# Patient Record
Sex: Female | Born: 1985 | Race: Black or African American | Hispanic: No | Marital: Single | Smoking: Former smoker
Health system: Southern US, Community
[De-identification: ages and names within clinical notes are randomized; demographics above are authoritative.]

## PROBLEM LIST (undated history)

## (undated) DIAGNOSIS — F32A Depression, unspecified: Secondary | ICD-10-CM

## (undated) DIAGNOSIS — F329 Major depressive disorder, single episode, unspecified: Secondary | ICD-10-CM

## (undated) HISTORY — PX: TUBAL LIGATION: SHX77

## (undated) HISTORY — PX: ANKLE SURGERY: SHX546

---

## 2005-07-05 ENCOUNTER — Emergency Department (HOSPITAL_COMMUNITY): Admission: EM | Admit: 2005-07-05 | Discharge: 2005-07-05 | Payer: Self-pay | Admitting: Emergency Medicine

## 2015-10-30 ENCOUNTER — Encounter (HOSPITAL_BASED_OUTPATIENT_CLINIC_OR_DEPARTMENT_OTHER): Payer: Self-pay | Admitting: Emergency Medicine

## 2015-10-30 ENCOUNTER — Emergency Department (HOSPITAL_BASED_OUTPATIENT_CLINIC_OR_DEPARTMENT_OTHER)
Admission: EM | Admit: 2015-10-30 | Discharge: 2015-10-30 | Disposition: A | Discharge status: Home / Self Care | Payer: Medicaid Other | Attending: Emergency Medicine | Admitting: Emergency Medicine

## 2015-10-30 ENCOUNTER — Emergency Department (HOSPITAL_BASED_OUTPATIENT_CLINIC_OR_DEPARTMENT_OTHER): Payer: Medicaid Other

## 2015-10-30 DIAGNOSIS — R1031 Right lower quadrant pain: Secondary | ICD-10-CM

## 2015-10-30 DIAGNOSIS — F17200 Nicotine dependence, unspecified, uncomplicated: Secondary | ICD-10-CM | POA: Insufficient documentation

## 2015-10-30 DIAGNOSIS — N390 Urinary tract infection, site not specified: Secondary | ICD-10-CM | POA: Diagnosis not present

## 2015-10-30 LAB — CBC
HCT: 39.9 % (ref 36.0–46.0)
Hemoglobin: 12.8 g/dL (ref 12.0–15.0)
MCH: 27 pg (ref 26.0–34.0)
MCHC: 32.1 g/dL (ref 30.0–36.0)
MCV: 84.2 fL (ref 78.0–100.0)
PLATELETS: 224 10*3/uL (ref 150–400)
RBC: 4.74 MIL/uL (ref 3.87–5.11)
RDW: 13.3 % (ref 11.5–15.5)
WBC: 9.8 10*3/uL (ref 4.0–10.5)

## 2015-10-30 LAB — COMPREHENSIVE METABOLIC PANEL
ALK PHOS: 75 U/L (ref 38–126)
ALT: 10 U/L — AB (ref 14–54)
AST: 16 U/L (ref 15–41)
Albumin: 4.2 g/dL (ref 3.5–5.0)
Anion gap: 5 (ref 5–15)
BUN: 11 mg/dL (ref 6–20)
CHLORIDE: 105 mmol/L (ref 101–111)
CO2: 27 mmol/L (ref 22–32)
CREATININE: 0.64 mg/dL (ref 0.44–1.00)
Calcium: 9.8 mg/dL (ref 8.9–10.3)
GFR calc Af Amer: >60 mL/min (ref >60)
GFR calc non Af Amer: >60 mL/min (ref >60)
Glucose, Bld: 95 mg/dL (ref 65–99)
Potassium: 3.8 mmol/L (ref 3.5–5.1)
Sodium: 137 mmol/L (ref 135–145)
Total Bilirubin: 0.5 mg/dL (ref 0.3–1.2)
Total Protein: 7.4 g/dL (ref 6.5–8.1)

## 2015-10-30 LAB — URINALYSIS, ROUTINE W REFLEX MICROSCOPIC
BILIRUBIN URINE: NEGATIVE
Glucose, UA: NEGATIVE mg/dL
Hgb urine dipstick: NEGATIVE
KETONES UR: NEGATIVE mg/dL
NITRITE: NEGATIVE
Protein, ur: NEGATIVE mg/dL
Specific Gravity, Urine: 1.005 (ref 1.005–1.030)
pH: 6.5 (ref 5.0–8.0)

## 2015-10-30 LAB — DIFFERENTIAL
Basophils Absolute: 0 10*3/uL (ref 0.0–0.1)
Basophils Relative: 0 %
EOS PCT: 0 %
Eosinophils Absolute: 0 10*3/uL (ref 0.0–0.7)
LYMPHS PCT: 27 %
Lymphs Abs: 2.6 10*3/uL (ref 0.7–4.0)
MONO ABS: 0.6 10*3/uL (ref 0.1–1.0)
Monocytes Relative: 6 %
Neutro Abs: 6.5 10*3/uL (ref 1.7–7.7)
Neutrophils Relative %: 67 %

## 2015-10-30 LAB — URINE MICROSCOPIC-ADD ON: RBC / HPF: NONE SEEN RBC/hpf (ref 0–5)

## 2015-10-30 LAB — PREGNANCY, URINE: PREG TEST UR: NEGATIVE

## 2015-10-30 LAB — LIPASE, BLOOD: LIPASE: 26 U/L (ref 11–51)

## 2015-10-30 MED ORDER — ONDANSETRON HCL 4 MG PO TABS: 4.0000 mg | ORAL_TABLET | Freq: Four times a day (QID) | ORAL | 0 refills | Status: DC | PRN

## 2015-10-30 MED ORDER — NITROFURANTOIN MONOHYD MACRO 100 MG PO CAPS
100.0000 mg | ORAL_CAPSULE | Freq: Once | ORAL | Status: AC
  Administered 2015-10-30: 100 mg via ORAL
  Filled 2015-10-30: qty 1

## 2015-10-30 MED ORDER — SODIUM CHLORIDE 0.9 % IV SOLN
1000.0000 mL | Freq: Once | INTRAVENOUS | Status: AC
  Administered 2015-10-30: 1000 mL via INTRAVENOUS

## 2015-10-30 MED ORDER — OXYCODONE-ACETAMINOPHEN 5-325 MG PO TABS: 1.0000 | ORAL_TABLET | ORAL | 0 refills | Status: DC | PRN

## 2015-10-30 MED ORDER — NITROFURANTOIN MONOHYD MACRO 100 MG PO CAPS: 100.0000 mg | ORAL_CAPSULE | Freq: Two times a day (BID) | ORAL | 0 refills | Status: DC

## 2015-10-30 MED ORDER — SODIUM CHLORIDE 0.9 % IV SOLN: 1000.0000 mL | INTRAVENOUS | Status: DC

## 2015-10-30 MED ORDER — ONDANSETRON HCL 4 MG/2ML IJ SOLN
4.0000 mg | Freq: Once | INTRAMUSCULAR | Status: AC
  Administered 2015-10-30: 4 mg via INTRAVENOUS
  Filled 2015-10-30: qty 2

## 2015-10-30 MED ORDER — IOPAMIDOL (ISOVUE-300) INJECTION 61%
100.0000 mL | Freq: Once | INTRAVENOUS | Status: AC | PRN
  Administered 2015-10-30: 100 mL via INTRAVENOUS

## 2015-10-30 MED ORDER — MORPHINE SULFATE (PF) 4 MG/ML IV SOLN
4.0000 mg | Freq: Once | INTRAVENOUS | Status: AC
  Administered 2015-10-30: 4 mg via INTRAVENOUS
  Filled 2015-10-30: qty 1

## 2015-10-30 NOTE — ED Notes (Signed)
MD at bedside. 

## 2015-10-30 NOTE — ED Notes (Signed)
Arm band made during downtime and will not scan correctly. Registration made aware and making new arm band.

## 2015-10-30 NOTE — ED Provider Notes (Signed)
[facility]   CSN: [id number] Arrival date & time: 10/30/15  0121  First Provider Contact:  First MD Initiated Contact with Patient 10/30/15 [phone]        History   Chief Complaint Chief Complaint  Patient presents with  . Abdominal Pain    HPI Renee Mcdonald is a 30 y.o. female.  The history is provided by the patient.  Abdominal Pain    She complains of right mid and lower abdominal pain which started about 6 PM and has been getting worse. It started out as a cramping became a steady pain. She rates pain at 7/10. It is worse with walking, laughing. Nothing makes it better. There is associated nausea but no vomiting. She denies urinary urgency, frequency, tenesmus, dysuria. She denies vaginal discharge. There is patient or diarrhea. She denies fever or chills. She is status post tubal ligation. Last menses was July 27 and was normal for her.  History reviewed. No pertinent past medical history.  There are no active problems to display for this patient.   Past Surgical History:  Procedure Laterality Date  . TUBAL LIGATION      OB History    No data available       Home Medications    Prior to Admission medications   Not on File    Family History No family history on file.  Social History Social History  Substance Use Topics  . Smoking status: Current Every Day Smoker    Packs/day: 0.50  . Smokeless tobacco: Never Used  . Alcohol use Yes     Comment: occ     Allergies   Review of patient's allergies indicates no known allergies.   Review of Systems Review of Systems  Gastrointestinal: Positive for abdominal pain.  All other systems reviewed and are negative.    Physical Exam Updated Vital Signs BP 147/100 (BP Location: Left Arm)   Pulse 67   Temp 98.6 F (37 C)   Resp 16   Ht  (1.727 m)   Wt 183 lb (83 kg)   LMP 10/17/2015 (Approximate)   SpO2 100%   BMI 27.83 kg/m   Physical Exam  Nursing note and  vitals reviewed.  30 year old female, resting comfortably and in no acute distress. Vital signs are significant for hypertension. Oxygen saturation is 100%, which is normal. Head is normocephalic and atraumatic. PERRLA, EOMI. Oropharynx is clear. Neck is nontender and supple without adenopathy or JVD. Back is nontender and there is no CVA tenderness. Lungs are clear without rales, wheezes, or rhonchi. Chest is nontender. Heart has regular rate and rhythm without murmur. Abdomen is soft, flat, with moderate tenderness in the right mid and lower abdomen. Tenderness is poorly localized. There is no rebound or guarding. There are no masses or hepatosplenomegaly and peristalsis is hypoactive. Extremities have no cyanosis or edema, full range of motion is present. Skin is warm and dry without rash. Neurologic: Mental status is normal, cranial nerves are intact, there are no motor or sensory deficits.  ED Treatments / Results  Labs (all labs ordered are listed, but only abnormal results are displayed) Labs Reviewed  URINALYSIS, ROUTINE W REFLEX MICROSCOPIC (NOT AT [facility]) - Abnormal; Notable for the following:       Result Value   Leukocytes, UA TRACE (*)    All other components within normal limits  URINE MICROSCOPIC-ADD ON - Abnormal; Notable for the following:    Squamous Epithelial / LPF 0-5 (*)  Bacteria, UA MANY (*)    All other components within normal limits  COMPREHENSIVE METABOLIC PANEL - Abnormal; Notable for the following:    ALT 10 (*)    All other components within normal limits  URINE CULTURE  PREGNANCY, URINE  CBC  DIFFERENTIAL  LIPASE, BLOOD   Radiology Ct Abdomen Pelvis W Contrast  Result Date: 10/30/2015 CLINICAL DATA:  Initial evaluation for acute right lower quadrant abdominal pain. Nausea. EXAM: CT ABDOMEN AND PELVIS WITH CONTRAST TECHNIQUE: Multidetector CT imaging of the abdomen and pelvis was performed using the standard protocol following bolus administration  of intravenous contrast. CONTRAST:  100mL ISOVUE-300 IOPAMIDOL (ISOVUE-300) INJECTION 61% COMPARISON:  Mild subsegmental atelectasis seen dependently within the visualized lung bases. Visualized lungs are otherwise clear. Liver demonstrates a normal contrast enhanced appearance. Gallbladder within normal limits. No biliary dilatation. Spleen, adrenal glands, and pancreas within normal limits. Kidneys are equal in size with symmetric enhancement. 3 mm nonobstructive stone present within the upper pole left kidney. Subcentimeter hypodensity within the left kidney noted, too small the characterize, but statistically likely reflects a small cyst. No hydronephrosis. No focal enhancing renal mass. Stomach within normal limits. No evidence for bowel obstruction. Appendix well visualized in the right lower quadrant and is of normal caliber and appearance associated inflammatory changes to suggest acute appendicitis. No abnormal wall thickening, mucosal enhancement, or inflammatory fat stranding seen about the bowels. Bladder partially distended without acute abnormality. Uterus and ovaries within normal limits for patient age. No free intraperitoneal air. No free fluid. No pathologically enlarged intra-abdominal or pelvic lymph nodes. Normal intravascular enhancement seen throughout the intra-abdominal aorta and its branch vessels. No acute osseous abnormality. No worrisome lytic or blastic osseous lesions. FINDINGS: 1. No CT evidence for acute intra-abdominal or pelvic process. 2. Normal appendix. 3. 3 mm nonobstructive left renal calculus. Electronically Signed   By: Rise MuBenjamin  McClintock M.D.   On: 10/30/2015 04:14    Procedures Procedures (including critical care time)  Medications Ordered in ED Medications  0.9 %  sodium chloride infusion (0 mLs Intravenous Stopped 10/30/15 0325)    Followed by  0.9 %  sodium chloride infusion (not administered)  nitrofurantoin (macrocrystal-monohydrate) (MACROBID) capsule 100  mg (not administered)  ondansetron (ZOFRAN) injection 4 mg (4 mg Intravenous Given 10/30/15 0232)  morphine 4 MG/ML injection 4 mg (4 mg Intravenous Given 10/30/15 0232)  iopamidol (ISOVUE-300) 61 % injection 100 mL (100 mLs Intravenous Contrast Given 10/30/15 0332)     Initial Impression / Assessment and Plan / ED Course  I have reviewed the triage vital signs and the nursing notes.  Pertinent labs & imaging results that were available during my care of the patient were reviewed by me and considered in my medical decision making (see chart for details).  Clinical Course    Abdominal pain concerning for appendicitis. She will be sent for CT of abdomen and pelvis. Urinalysis does show significant amount of bacteria although no pyuria. Urine will be sent for culture.  CT shows no acute process. No explanation for pain other than urinary tract infection. She is given a prescription for nitrofurantoin and also 2 days supply of oxycodone-acetaminophen and also ondansetron. Return precautions discussed  Final Clinical Impressions(s) / ED Diagnoses   Final diagnoses:  Right lower quadrant abdominal pain  Urinary tract infection without hematuria, site unspecified    New Prescriptions New Prescriptions   NITROFURANTOIN, MACROCRYSTAL-MONOHYDRATE, (MACROBID) 100 MG CAPSULE    Take 1 capsule (100 mg total) by mouth 2 (  two) times daily. X 7 days   ONDANSETRON (ZOFRAN) 4 MG TABLET    Take 1 tablet (4 mg total) by mouth every 6 (six) hours as needed for nausea or vomiting.   OXYCODONE-ACETAMINOPHEN (PERCOCET) 5-325 MG TABLET    Take 1 tablet by mouth every 4 (four) hours as needed for moderate pain.     Dione Booze, MD 10/30/15 [phone]

## 2015-10-30 NOTE — ED Triage Notes (Signed)
Sharp RLQ pain that started at 5pm.  Tender to touch.  Increased pain with walking. Denies N/V/D.

## 2015-10-31 LAB — URINE CULTURE

## 2015-12-09 ENCOUNTER — Emergency Department (HOSPITAL_BASED_OUTPATIENT_CLINIC_OR_DEPARTMENT_OTHER)
Admission: EM | Admit: 2015-12-09 | Discharge: 2015-12-10 | Disposition: A | Discharge status: Home / Self Care | Payer: Medicaid Other | Attending: Emergency Medicine | Admitting: Emergency Medicine

## 2015-12-09 ENCOUNTER — Encounter (HOSPITAL_BASED_OUTPATIENT_CLINIC_OR_DEPARTMENT_OTHER): Payer: Self-pay | Admitting: *Deleted

## 2015-12-09 ENCOUNTER — Emergency Department (HOSPITAL_BASED_OUTPATIENT_CLINIC_OR_DEPARTMENT_OTHER): Payer: Medicaid Other

## 2015-12-09 DIAGNOSIS — R69 Illness, unspecified: Secondary | ICD-10-CM | POA: Diagnosis present

## 2015-12-09 DIAGNOSIS — F17200 Nicotine dependence, unspecified, uncomplicated: Secondary | ICD-10-CM | POA: Diagnosis not present

## 2015-12-09 DIAGNOSIS — J069 Acute upper respiratory infection, unspecified: Secondary | ICD-10-CM | POA: Insufficient documentation

## 2015-12-09 NOTE — ED Triage Notes (Signed)
Pt here for feeling ill, pt has had cold and fever and body aches.  This all began this am.  No cough

## 2015-12-09 NOTE — ED Provider Notes (Signed)
[facility]   CSN: [id number] Arrival date & time: 12/09/15  2214  By signing my name below, I, Rosario AdieWilliam Andrew Hiatt, attest that this documentation has been prepared under the direction and in the presence of Audry Piliyler Jaquarious Grey, PA-C.  Electronically Signed: Rosario AdieWilliam Andrew Hiatt, ED Scribe. 12/09/15. 11:44 PM.  History   Chief Complaint Chief Complaint  Patient presents with  . Illness   The history is provided by the patient. No language interpreter was used.   HPI Comments: Renee Mcdonald is a 30 y.o. female with no other PMHx, who presents to the Emergency Department complaining of generalized malaise onset ~13 hours PTA. Associated symptoms include chills, subjective fever, nausea, light-headedness, loss of appetite. She describes her body aches as "1000 needles stabbing me at one time". No treatments were tried prior to coming into the ED. Pt is currently tolerating PO well. Denies cough, SOB, sore throat, rhinorrhea, ear pain, congestion, emesis, diarrhea, abdominal pain, chest pain, dysuria, neck stiffness, or any other associated symptoms.   History reviewed. No pertinent past medical history.  There are no active problems to display for this patient.  Past Surgical History:  Procedure Laterality Date  . TUBAL LIGATION     OB History    No data available     Home Medications    Prior to Admission medications   Medication Sig Start Date End Date Taking? Authorizing Provider  nitrofurantoin, macrocrystal-monohydrate, (MACROBID) 100 MG capsule Take 1 capsule (100 mg total) by mouth 2 (two) times daily. X 7 days 10/30/15   Dione Boozeavid Glick, MD  ondansetron [facility](ZOFRAN) 4 MG tablet Take 1 tablet (4 mg total) by mouth every 6 (six) hours as needed for nausea or vomiting. 10/30/15   Dione Boozeavid Glick, MD  oxyCODONE-acetaminophen (PERCOCET) 5-325 MG tablet Take 1 tablet by mouth every 4 (four) hours as needed for moderate pain. 10/30/15   Dione Boozeavid Glick, MD   Family  History No family history on file.  Social History Social History  Substance Use Topics  . Smoking status: Current Every Day Smoker    Packs/day: 0.50  . Smokeless tobacco: Never Used  . Alcohol use Yes     Comment: occ   Allergies   Review of patient's allergies indicates no known allergies.  Review of Systems Review of Systems  Constitutional: Positive for chills and fever (subjective).  HENT: Negative for congestion, ear pain, rhinorrhea and sore throat.   Respiratory: Negative for cough and shortness of breath.   Cardiovascular: Negative for chest pain.  Gastrointestinal: Positive for nausea. Negative for abdominal pain, diarrhea and vomiting.  Genitourinary: Negative for dysuria.  Musculoskeletal: Positive for myalgias (generalized). Negative for neck stiffness.  Neurological: Positive for light-headedness.   Physical Exam Updated Vital Signs BP 130/90 (BP Location: Left Arm)   Pulse 108   Temp 100.4 F (38 C) (Oral)   Resp 16   Ht 5\' 8"  (1.727 m)   Wt 180 lb (81.6 kg)   SpO2 97%   BMI 27.37 kg/m   Physical Exam  Constitutional: She is oriented to person, place, and time. Vital signs are normal. She appears well-developed and well-nourished.  HENT:  Head: Normocephalic and atraumatic.  Right Ear: Hearing, tympanic membrane and ear canal normal.  Left Ear: Hearing, tympanic membrane and ear canal normal.  Nose: Nose normal.  Mouth/Throat: Uvula is midline, oropharynx is clear and moist and mucous membranes are normal. No trismus in the jaw. No uvula swelling. No oropharyngeal exudate.  No signs  of tonsillar hypertrophy, no exudates. Uvula is midline without edema or erythema. No trismus, or dooling. Phonation and speech are intact. No signs of peritonsillar abscess.    Eyes: Conjunctivae and EOM are normal. Pupils are equal, round, and reactive to light.  Neck: Trachea normal, normal range of motion, full passive range of motion without pain and phonation normal.  Neck supple. No Brudzinski's sign and no Kernig's sign noted.  Cardiovascular: Regular rhythm.  Tachycardia present.   Pulmonary/Chest: Effort normal and breath sounds normal. No respiratory distress. She has no decreased breath sounds. She has no wheezes. She has no rhonchi. She has no rales.  Abdominal: She exhibits no distension.  Musculoskeletal: Normal range of motion.  Neurological: She is alert and oriented to person, place, and time.  Skin: Skin is warm and dry.  Psychiatric: She has a normal mood and affect. Her speech is normal and behavior is normal. Thought content normal.  Nursing note and vitals reviewed.  ED Treatments / Results  DIAGNOSTIC STUDIES: Oxygen Saturation is 97% on RA, normal by my interpretation.   COORDINATION OF CARE: 11:43 PM-Discussed next steps with pt. Pt verbalized understanding and is agreeable with the plan.   Labs (all labs ordered are listed, but only abnormal results are displayed) Labs Reviewed - No data to display  EKG  EKG Interpretation None      Radiology Dg Chest 2 View  Result Date: 12/10/2015 CLINICAL DATA:  Fever, shortness of breath, and cough since this morning. Current smoker. EXAM: CHEST  2 VIEW COMPARISON:  None. FINDINGS: The heart size and mediastinal contours are within normal limits. Both lungs are clear. The visualized skeletal structures are unremarkable. IMPRESSION: No active cardiopulmonary disease. Electronically Signed   By: Burman Nieves M.D.   On: 12/10/2015 00:00    Procedures Procedures (including critical care time)  Medications Ordered in ED Medications - No data to display  Initial Impression / Assessment and Plan / ED Course  I have reviewed the triage vital signs and the nursing notes.  Pertinent labs & imaging results that were available during my care of the patient were reviewed by me and considered in my medical decision making (see chart for details).  Clinical Course   I have reviewed and  evaluated the relevant imaging studies.  I have reviewed the relevant previous healthcare records. I obtained HPI from historian.  ED Course:  Assessment: Pt is a 29yo female presents with generalized malaise, fever, chills, and nausea since this morning. On exam, pt in NAD. VSS. Pt is febrile in the ED with a temperature of 100.4. Lungs CTA, Heart RRR. Abdomen nontender/soft. Pt CXR negative for acute infiltrate. Patients symptoms are consistent with URI, likely viral etiology. Discussed that antibiotics are not indicated for viral infections. Pt will be discharged with symptomatic treatment.  Verbalizes understanding and is agreeable with plan. Pt is hemodynamically stable & in NAD prior to dc.  Disposition/Plan:  DC HOme Additional Verbal discharge instructions given and discussed with patient.  Pt Instructed to f/u with PCP in the next week for evaluation and treatment of symptoms. Return precautions given Pt acknowledges and agrees with plan  Supervising Physician Shon Baton, MD    Final Clinical Impressions(s) / ED Diagnoses   Final diagnoses:  Viral URI    New Prescriptions New Prescriptions   No medications on file   I personally performed the services described in this documentation, which was scribed in my presence. The recorded information has been reviewed  and is accurate.    Audry Pili, PA-C 12/10/15 0005    Shon Baton, MD 12/10/15 [phone]

## 2015-12-10 NOTE — Discharge Instructions (Signed)
Please read and follow all provided instructions.  Your diagnoses today include:  1. Viral URI    You appear to have an upper respiratory infection (URI). An upper respiratory tract infection, or cold, is a viral infection of the air passages leading to the lungs. It should improve gradually after 5-7 days. You may have a lingering cough that lasts for 2- 4 weeks after the infection.  Tests performed today include: Vital signs. See below for your results today.   Medications prescribed:   Take any prescribed medications only as directed. Treatment for your infection is aimed at treating the symptoms. There are no medications, such as antibiotics, that will cure your infection.   Home care instructions:  Follow any educational materials contained in this packet.   Your illness is contagious and can be spread to others, especially during the first 3 or 4 days. It cannot be cured by antibiotics or other medicines. Take basic precautions such as washing your hands often, covering your mouth when you cough or sneeze, and avoiding public places where you could spread your illness to others.   Please continue drinking plenty of fluids.  Use over-the-counter medicines as needed as directed on packaging for symptom relief.  You may also use ibuprofen or tylenol as directed on packaging for pain or fever.  Do not take multiple medicines containing Tylenol or acetaminophen to avoid taking too much of this medication.  Follow-up instructions: Please follow-up with your primary care provider in the next 3 days for further evaluation of your symptoms if you are not feeling better.   Return instructions:  Please return to the Emergency Department if you experience worsening symptoms.  RETURN IMMEDIATELY IF you develop shortness of breath, confusion or altered mental status, a new rash, become dizzy, faint, or poorly responsive, or are unable to be cared for at home. Please return if you have persistent  vomiting and cannot keep down fluids or develop a fever that is not controlled by tylenol or motrin.   Please return if you have any other emergent concerns.  Additional Information:  Your vital signs today were: BP 130/90 (BP Location: Left Arm)    Pulse 108    Temp 100.4 F (38 C) (Oral)    Resp 16    Ht 5\' 8"  (1.727 m)    Wt 81.6 kg    LMP 12/09/2015    SpO2 97%    BMI 27.37 kg/m  If your blood pressure (BP) was elevated above 135/85 this visit, please have this repeated by your doctor within one month. --------------

## 2016-10-25 ENCOUNTER — Emergency Department (HOSPITAL_BASED_OUTPATIENT_CLINIC_OR_DEPARTMENT_OTHER)
Admission: EM | Admit: 2016-10-25 | Discharge: 2016-10-25 | Disposition: A | Discharge status: Home / Self Care | Payer: Medicaid Other | Attending: Emergency Medicine | Admitting: Emergency Medicine

## 2016-10-25 ENCOUNTER — Encounter (HOSPITAL_BASED_OUTPATIENT_CLINIC_OR_DEPARTMENT_OTHER): Payer: Self-pay | Admitting: Emergency Medicine

## 2016-10-25 DIAGNOSIS — R102 Pelvic and perineal pain: Secondary | ICD-10-CM | POA: Insufficient documentation

## 2016-10-25 DIAGNOSIS — Z5321 Procedure and treatment not carried out due to patient leaving prior to being seen by health care provider: Secondary | ICD-10-CM | POA: Diagnosis not present

## 2016-10-25 HISTORY — DX: Major depressive disorder, single episode, unspecified: F32.9

## 2016-10-25 HISTORY — DX: Depression, unspecified: F32.A

## 2016-10-25 LAB — URINALYSIS, ROUTINE W REFLEX MICROSCOPIC
BILIRUBIN URINE: NEGATIVE
GLUCOSE, UA: NEGATIVE mg/dL
HGB URINE DIPSTICK: NEGATIVE
KETONES UR: NEGATIVE mg/dL
Leukocytes, UA: NEGATIVE
Nitrite: NEGATIVE
PROTEIN: NEGATIVE mg/dL
Specific Gravity, Urine: 1.026 (ref 1.005–1.030)
pH: 6 (ref 5.0–8.0)

## 2016-10-25 LAB — PREGNANCY, URINE: PREG TEST UR: NEGATIVE

## 2016-10-25 NOTE — ED Triage Notes (Signed)
Patient states that she has had lower pelvic pain and pressure x 2 -4  Days. Reports N / V

## 2016-10-26 ENCOUNTER — Encounter (HOSPITAL_BASED_OUTPATIENT_CLINIC_OR_DEPARTMENT_OTHER): Payer: Self-pay | Admitting: *Deleted

## 2016-10-26 ENCOUNTER — Emergency Department (HOSPITAL_BASED_OUTPATIENT_CLINIC_OR_DEPARTMENT_OTHER)
Admission: EM | Admit: 2016-10-26 | Discharge: 2016-10-26 | Disposition: A | Discharge status: Home / Self Care | Payer: Medicaid Other | Attending: Emergency Medicine | Admitting: Emergency Medicine

## 2016-10-26 DIAGNOSIS — N760 Acute vaginitis: Secondary | ICD-10-CM | POA: Diagnosis not present

## 2016-10-26 DIAGNOSIS — B9689 Other specified bacterial agents as the cause of diseases classified elsewhere: Secondary | ICD-10-CM

## 2016-10-26 DIAGNOSIS — F17210 Nicotine dependence, cigarettes, uncomplicated: Secondary | ICD-10-CM | POA: Diagnosis not present

## 2016-10-26 DIAGNOSIS — R1030 Lower abdominal pain, unspecified: Secondary | ICD-10-CM | POA: Diagnosis present

## 2016-10-26 LAB — COMPREHENSIVE METABOLIC PANEL
ALBUMIN: 3.8 g/dL (ref 3.5–5.0)
ALT: 11 U/L — ABNORMAL LOW (ref 14–54)
ANION GAP: 5 (ref 5–15)
AST: 16 U/L (ref 15–41)
Alkaline Phosphatase: 73 U/L (ref 38–126)
BUN: 14 mg/dL (ref 6–20)
CHLORIDE: 107 mmol/L (ref 101–111)
CO2: 27 mmol/L (ref 22–32)
Calcium: 9.1 mg/dL (ref 8.9–10.3)
Creatinine, Ser: 0.74 mg/dL (ref 0.44–1.00)
GFR calc Af Amer: >60 mL/min (ref >60)
GFR calc non Af Amer: >60 mL/min (ref >60)
Glucose, Bld: 107 mg/dL — ABNORMAL HIGH (ref 65–99)
POTASSIUM: 3.7 mmol/L (ref 3.5–5.1)
SODIUM: 139 mmol/L (ref 135–145)
Total Bilirubin: 0.3 mg/dL (ref 0.3–1.2)
Total Protein: 6.7 g/dL (ref 6.5–8.1)

## 2016-10-26 LAB — CBC WITH DIFFERENTIAL/PLATELET
BASOS ABS: 0 10*3/uL (ref 0.0–0.1)
Basophils Relative: 0 %
Eosinophils Absolute: 0 10*3/uL (ref 0.0–0.7)
Eosinophils Relative: 0 %
HEMATOCRIT: 36.5 % (ref 36.0–46.0)
HEMOGLOBIN: 11.5 g/dL — AB (ref 12.0–15.0)
LYMPHS PCT: 32 %
Lymphs Abs: 2.2 10*3/uL (ref 0.7–4.0)
MCH: 26.6 pg (ref 26.0–34.0)
MCHC: 31.5 g/dL (ref 30.0–36.0)
MCV: 84.3 fL (ref 78.0–100.0)
MONO ABS: 0.6 10*3/uL (ref 0.1–1.0)
MONOS PCT: 9 %
NEUTROS ABS: 4 10*3/uL (ref 1.7–7.7)
NEUTROS PCT: 59 %
Platelets: 247 10*3/uL (ref 150–400)
RBC: 4.33 MIL/uL (ref 3.87–5.11)
RDW: 13.7 % (ref 11.5–15.5)
WBC: 6.9 10*3/uL (ref 4.0–10.5)

## 2016-10-26 LAB — URINALYSIS, ROUTINE W REFLEX MICROSCOPIC
GLUCOSE, UA: NEGATIVE mg/dL
Hgb urine dipstick: NEGATIVE
Ketones, ur: 15 mg/dL — AB
LEUKOCYTES UA: NEGATIVE
Nitrite: NEGATIVE
PROTEIN: NEGATIVE mg/dL
Specific Gravity, Urine: 1.04 — ABNORMAL HIGH (ref 1.005–1.030)
pH: 5.5 (ref 5.0–8.0)

## 2016-10-26 LAB — LIPASE, BLOOD: Lipase: 26 U/L (ref 11–51)

## 2016-10-26 LAB — WET PREP, GENITAL
SPERM: NONE SEEN
TRICH WET PREP: NONE SEEN
Yeast Wet Prep HPF POC: NONE SEEN

## 2016-10-26 MED ORDER — ONDANSETRON 4 MG PO TBDP: 4.0000 mg | ORAL_TABLET | Freq: Three times a day (TID) | ORAL | 0 refills | Status: DC | PRN

## 2016-10-26 MED ORDER — METRONIDAZOLE 500 MG PO TABS: 500.0000 mg | ORAL_TABLET | Freq: Two times a day (BID) | ORAL | 0 refills | Status: DC

## 2016-10-26 MED ORDER — DICYCLOMINE HCL 10 MG PO CAPS
10.0000 mg | ORAL_CAPSULE | Freq: Once | ORAL | Status: AC
  Administered 2016-10-26: 10 mg via ORAL
  Filled 2016-10-26: qty 1

## 2016-10-26 MED ORDER — SODIUM CHLORIDE 0.9 % IV BOLUS (SEPSIS)
500.0000 mL | Freq: Once | INTRAVENOUS | Status: AC
  Administered 2016-10-26: 500 mL via INTRAVENOUS

## 2016-10-26 MED ORDER — DICYCLOMINE HCL 20 MG PO TABS: 20.0000 mg | ORAL_TABLET | Freq: Two times a day (BID) | ORAL | 0 refills | Status: DC | PRN

## 2016-10-26 MED ORDER — METRONIDAZOLE 500 MG PO TABS
500.0000 mg | ORAL_TABLET | Freq: Once | ORAL | Status: AC
  Administered 2016-10-26: 500 mg via ORAL
  Filled 2016-10-26: qty 1

## 2016-10-26 MED ORDER — ONDANSETRON HCL 4 MG/2ML IJ SOLN
4.0000 mg | Freq: Once | INTRAMUSCULAR | Status: AC
  Administered 2016-10-26: 4 mg via INTRAVENOUS
  Filled 2016-10-26: qty 2

## 2016-10-26 NOTE — Discharge Instructions (Signed)

## 2016-10-26 NOTE — ED Triage Notes (Signed)
C/o left lower abd pain that started 4 days ago. C/o nausea times one week. State she only has emesis in the morning. Denies any fevers. Denies any diarrhea. Denies any urinary symptoms. Describes pain as a dull pressure that is constant. States laying on her left side makes it hurt less. Has taken tylenol without relief.

## 2016-10-26 NOTE — ED Provider Notes (Signed)
Emergency Department Provider Note   I have reviewed the triage vital signs and the nursing notes.   HISTORY  Chief Complaint Abdominal Pain   HPI Renee Mcdonald is a 31 y.o. female with PMH of tubal ligation and depression presents to the emergency department for evaluation of lower abdominal pressure over the last week. Symptoms have worsened and moved to the left lower quadrant of the abdomen. Also experiencing some left-sided lower back pain. No fevers or chills. Denies any vaginal bleeding or discharge. LMP July 28th. Patient has experienced 2 episodes of vomiting this morning which is new for her. No diarrhea. No sick contacts. No recent travel history.   Past Medical History:  Diagnosis Date  . Depression     There are no active problems to display for this patient.   Past Surgical History:  Procedure Laterality Date  . TUBAL LIGATION      Current Outpatient Rx  . Order #: [id number] Class: Historical Med  . Order #: [id number] Class: Print  . Order #: [id number] Class: Print  . Order #: [id number] Class: Print  . Order #: [id number] Class: Print  . Order #: [id number] Class: Print  . Order #: [id number] Class: Print  . Order #: [id number] Class: Historical Med    Allergies Patient has no known allergies.  No family history on file.  Social History Social History  Substance Use Topics  . Smoking status: Current Every Day Smoker    Packs/day: 0.50  . Smokeless tobacco: Never Used  . Alcohol use Yes     Comment: occ    Review of Systems  Constitutional: No fever/chills Eyes: No visual changes. ENT: No sore throat. Cardiovascular: Denies chest pain. Respiratory: Denies shortness of breath. Gastrointestinal: No abdominal pain.  No nausea, no vomiting.  No diarrhea.  No constipation. Genitourinary: Negative for dysuria. Musculoskeletal: Negative for back pain. Skin: Negative for rash. Neurological: Negative for headaches, focal weakness or  numbness.  10-point ROS otherwise negative.  ____________________________________________   PHYSICAL EXAM:  VITAL SIGNS: ED Triage Vitals  Enc Vitals Group     BP 10/26/16 0809 126/83     Pulse Rate 10/26/16 0809 90     Resp 10/26/16 0809 14     Temp 10/26/16 0809 98.9 F (37.2 C)     Temp Source 10/26/16 0809 Oral     SpO2 10/26/16 0809 98 %     Weight 10/26/16 0809 200 lb (90.7 kg)     Height 10/26/16 0809 5' 8.5" (1.74 m)     Pain Score 10/26/16 0821 7   Constitutional: Alert and oriented. Well appearing and in no acute distress. Eyes: Conjunctivae are normal.  Head: Atraumatic. Nose: No congestion/rhinnorhea. Mouth/Throat: Mucous membranes are moist.  Neck: No stridor.   Cardiovascular: Normal rate, regular rhythm. Good peripheral circulation. Grossly normal heart sounds.   Respiratory: Normal respiratory effort.  No retractions. Lungs CTAB. Gastrointestinal: Soft with mild lower abdominal tenderness. No rebound or guarding. No distention.  Genitourinary: Moderate foul-smelling vaginal discharge. No external genital lesions. No vaginal bleeding. No CMT. Mild adnexal tenderness with no fullness appreciated.  Musculoskeletal: No lower extremity tenderness nor edema. No gross deformities of extremities. Neurologic:  Normal speech and language. No gross focal neurologic deficits are appreciated.  Skin:  Skin is warm, dry and intact. No rash noted. Psychiatric: Mood and affect are normal. Speech and behavior are normal.  ____________________________________________   LABS (all labs ordered are listed, but only abnormal results are displayed)  Labs Reviewed  WET PREP, GENITAL - Abnormal;  Notable for the following:       Result Value   Clue Cells Wet Prep HPF POC PRESENT (*)    WBC, Wet Prep HPF POC MANY (*)    All other components within normal limits  URINALYSIS, ROUTINE W REFLEX MICROSCOPIC - Abnormal; Notable for the following:    Color, Urine AMBER (*)     APPearance CLOUDY (*)    Specific Gravity, Urine 1.040 (*)    Bilirubin Urine SMALL (*)    Ketones, ur 15 (*)    All other components within normal limits  COMPREHENSIVE METABOLIC PANEL - Abnormal; Notable for the following:    Glucose, Bld 107 (*)    ALT 11 (*)    All other components within normal limits  CBC WITH DIFFERENTIAL/PLATELET - Abnormal; Notable for the following:    Hemoglobin 11.5 (*)    All other components within normal limits  LIPASE, BLOOD  HIV ANTIBODY (ROUTINE TESTING)  GC/CHLAMYDIA PROBE AMP ([place]) NOT AT [facility]   ____________________________________________  RADIOLOGY  None ____________________________________________   PROCEDURES  Procedure(s) performed:   Procedures  None ____________________________________________   INITIAL IMPRESSION / ASSESSMENT AND PLAN / ED COURSE  Pertinent labs & imaging results that were available during my care of the patient were reviewed by me and considered in my medical decision making (see chart for details).  Patient presents to the emergency department for evaluation of lower abdominal pressure and the left sided discomfort. No fevers or chills. Patient has very mild lower abdominal tenderness to palpation.  Differential diagnosis includes but is not exclusive to ectopic pregnancy, ovarian cyst, ovarian torsion, acute appendicitis, urinary tract infection, endometriosis, bowel obstruction, hernia, colitis, renal colic, gastroenteritis, volvulus etc.  Labs and pelvic largely unremarkable. Plan for treatment of BV. No evidence of PID or TOA. No indication for further abdominal imaging at this time. Discussed supportive care at home and PCP follow up plan.   At this time, I do not feel there is any life-threatening condition present. I have reviewed and discussed all results (EKG, imaging, lab, urine as appropriate), exam findings with patient. I have reviewed nursing notes and appropriate previous records.  I  feel the patient is safe to be discharged home without further emergent workup. Discussed usual and customary return precautions. Patient and family (if present) verbalize understanding and are comfortable with this plan.  Patient will follow-up with their primary care provider. If they do not have a primary care provider, information for follow-up has been provided to them. All questions have been answered.   ____________________________________________  FINAL CLINICAL IMPRESSION(S) / ED DIAGNOSES  Final diagnoses:  Lower abdominal pain  Bacterial vaginosis     MEDICATIONS GIVEN DURING THIS VISIT:  Medications  sodium chloride 0.9 % bolus 500 mL (0 mLs Intravenous Stopped 10/26/16 0944)  ondansetron (ZOFRAN) injection 4 mg (4 mg Intravenous Given 10/26/16 0919)  dicyclomine (BENTYL) capsule 10 mg (10 mg Oral Given 10/26/16 1012)  metroNIDAZOLE (FLAGYL) tablet 500 mg (500 mg Oral Given 10/26/16 1011)     NEW OUTPATIENT MEDICATIONS STARTED DURING THIS VISIT:  Discharge Medication List as of 10/26/2016 10:01 AM    START taking these medications   Details  dicyclomine (BENTYL) 20 MG tablet Take 1 tablet (20 mg total) by mouth 2 (two) times daily as needed for spasms., Starting Mon 10/26/2016, Print    metroNIDAZOLE (FLAGYL) 500 MG tablet Take 1 tablet (500 mg total) by mouth 2 (two) times daily., Starting Mon 10/26/2016, Print  ondansetron (ZOFRAN ODT) 4 MG disintegrating tablet Take 1 tablet (4 mg total) by mouth every 8 (eight) hours as needed for nausea or vomiting., Starting Mon 10/26/2016, Print        Note:  This document was prepared using Dragon voice recognition software and may include unintentional dictation errors.  Alona Bene, MD Emergency Medicine    Brennan Karam, Arlyss Repress, MD 10/26/16 [phone]

## 2016-10-27 LAB — HIV ANTIBODY (ROUTINE TESTING W REFLEX): HIV Screen 4th Generation wRfx: NONREACTIVE

## 2016-10-27 LAB — GC/CHLAMYDIA PROBE AMP (~~LOC~~) NOT AT ARMC
CHLAMYDIA, DNA PROBE: NEGATIVE
NEISSERIA GONORRHEA: NEGATIVE

## 2016-11-04 ENCOUNTER — Emergency Department (HOSPITAL_BASED_OUTPATIENT_CLINIC_OR_DEPARTMENT_OTHER)
Admission: EM | Admit: 2016-11-04 | Discharge: 2016-11-04 | Disposition: A | Discharge status: Home / Self Care | Payer: Medicaid Other | Attending: Emergency Medicine | Admitting: Emergency Medicine

## 2016-11-04 ENCOUNTER — Encounter (HOSPITAL_BASED_OUTPATIENT_CLINIC_OR_DEPARTMENT_OTHER): Payer: Self-pay

## 2016-11-04 DIAGNOSIS — Z79899 Other long term (current) drug therapy: Secondary | ICD-10-CM | POA: Insufficient documentation

## 2016-11-04 DIAGNOSIS — R1030 Lower abdominal pain, unspecified: Secondary | ICD-10-CM | POA: Diagnosis present

## 2016-11-04 DIAGNOSIS — R1032 Left lower quadrant pain: Secondary | ICD-10-CM

## 2016-11-04 DIAGNOSIS — F17200 Nicotine dependence, unspecified, uncomplicated: Secondary | ICD-10-CM | POA: Diagnosis not present

## 2016-11-04 LAB — COMPREHENSIVE METABOLIC PANEL
ALBUMIN: 3.9 g/dL (ref 3.5–5.0)
ALT: 12 U/L — AB (ref 14–54)
AST: 24 U/L (ref 15–41)
Alkaline Phosphatase: 76 U/L (ref 38–126)
Anion gap: 9 (ref 5–15)
BILIRUBIN TOTAL: 0.3 mg/dL (ref 0.3–1.2)
BUN: 9 mg/dL (ref 6–20)
CHLORIDE: 102 mmol/L (ref 101–111)
CO2: 25 mmol/L (ref 22–32)
CREATININE: 0.75 mg/dL (ref 0.44–1.00)
Calcium: 9 mg/dL (ref 8.9–10.3)
GFR calc Af Amer: >60 mL/min (ref >60)
GFR calc non Af Amer: >60 mL/min (ref >60)
GLUCOSE: 187 mg/dL — AB (ref 65–99)
POTASSIUM: 3.5 mmol/L (ref 3.5–5.1)
Sodium: 136 mmol/L (ref 135–145)
TOTAL PROTEIN: 6.6 g/dL (ref 6.5–8.1)

## 2016-11-04 LAB — URINALYSIS, MICROSCOPIC (REFLEX)

## 2016-11-04 LAB — URINALYSIS, ROUTINE W REFLEX MICROSCOPIC
GLUCOSE, UA: NEGATIVE mg/dL
HGB URINE DIPSTICK: NEGATIVE
KETONES UR: 15 mg/dL — AB
Nitrite: NEGATIVE
PH: 5.5 (ref 5.0–8.0)
PROTEIN: NEGATIVE mg/dL
Specific Gravity, Urine: 1.023 (ref 1.005–1.030)

## 2016-11-04 LAB — CBC
HEMATOCRIT: 38.3 % (ref 36.0–46.0)
Hemoglobin: 11.9 g/dL — ABNORMAL LOW (ref 12.0–15.0)
MCH: 26.3 pg (ref 26.0–34.0)
MCHC: 31.1 g/dL (ref 30.0–36.0)
MCV: 84.5 fL (ref 78.0–100.0)
PLATELETS: 278 10*3/uL (ref 150–400)
RBC: 4.53 MIL/uL (ref 3.87–5.11)
RDW: 13.9 % (ref 11.5–15.5)
WBC: 7.5 10*3/uL (ref 4.0–10.5)

## 2016-11-04 LAB — LIPASE, BLOOD: Lipase: 28 U/L (ref 11–51)

## 2016-11-04 LAB — PREGNANCY, URINE: PREG TEST UR: NEGATIVE

## 2016-11-04 NOTE — ED Triage Notes (Signed)
C/o lower abd/lower back pian x 2 weeks-states she was seen here for same but pain is worse-denies n/v/d, vaginal d/c-NAD-steady gait

## 2016-11-04 NOTE — ED Provider Notes (Signed)
[facility]   CSN: [id number] Arrival date & time: 11/04/16  1252     History   Chief Complaint Chief Complaint  Patient presents with  . Abdominal Pain    HPI Renee Mcdonald is a 31 y.o. female.  HPI Patient is a 31 year old female presents to the emergency department with ongoing low abdominal and low back pain.  Denies nausea vomiting diarrhea.  Her pain comes and goes.  No history of ureteral stones.  Status post tubal ligation.  No vaginal complaints.  Was recently seen and evaluated and treated for bacterial vaginosis.  Symptoms are mild in severity.  Patient does not want any pain medication today   Past Medical History:  Diagnosis Date  . Depression     There are no active problems to display for this patient.   Past Surgical History:  Procedure Laterality Date  . TUBAL LIGATION      OB History    No data available       Home Medications    Prior to Admission medications   Medication Sig Start Date End Date Taking? Authorizing Provider  busPIRone (BUSPAR) 7.5 MG tablet Take 7.5 mg by mouth 3 (three) times daily.    [provider]  dicyclomine (BENTYL) 20 MG tablet Take 1 tablet (20 mg total) by mouth 2 (two) times daily as needed for spasms. 10/26/16   Long, Arlyss Repress, MD  sertraline (ZOLOFT) 100 MG tablet Take 100 mg by mouth daily.    [provider]    Family History No family history on file.  Social History Social History  Substance Use Topics  . Smoking status: Current Every Day Smoker    Packs/day: 0.50  . Smokeless tobacco: Never Used  . Alcohol use Yes     Comment: occ     Allergies   Patient has no known allergies.   Review of Systems Review of Systems  All other systems reviewed and are negative.    Physical Exam Updated Vital Signs BP 128/79 (BP Location: Left Arm)   Pulse (!) 101   Temp 98.7 F (37.1 C) (Oral)   Resp 18   Ht 5' 8.5" (1.74 m)   Wt 91.7 kg (202 lb 2 oz)    LMP 11/01/2016   SpO2 100%   BMI 30.29 kg/m   Physical Exam  Constitutional: She is oriented to person, place, and time. She appears well-developed and well-nourished. No distress.  HENT:  Head: Normocephalic and atraumatic.  Eyes: EOM are normal.  Neck: Normal range of motion.  Cardiovascular: Normal rate and regular rhythm.   Pulmonary/Chest: Effort normal and breath sounds normal.  Abdominal: Soft. She exhibits no distension and no mass. There is no tenderness. There is no guarding.  Musculoskeletal: Normal range of motion.  Neurological: She is alert and oriented to person, place, and time.  Skin: Skin is warm and dry.  Psychiatric: She has a normal mood and affect.  Nursing note and vitals reviewed.    ED Treatments / Results  Labs (all labs ordered are listed, but only abnormal results are displayed) Labs Reviewed  COMPREHENSIVE METABOLIC PANEL - Abnormal; Notable for the following:       Result Value   Glucose, Bld 187 (*)    ALT 12 (*)    All other components within normal limits  CBC - Abnormal; Notable for the following:    Hemoglobin 11.9 (*)    All other components within normal limits  URINALYSIS,  ROUTINE W REFLEX MICROSCOPIC - Abnormal; Notable for the following:    APPearance CLOUDY (*)    Bilirubin Urine SMALL (*)    Ketones, ur 15 (*)    Leukocytes, UA SMALL (*)    All other components within normal limits  URINALYSIS, MICROSCOPIC (REFLEX) - Abnormal; Notable for the following:    Bacteria, UA FEW (*)    Squamous Epithelial / LPF 6-30 (*)    All other components within normal limits  LIPASE, BLOOD  PREGNANCY, URINE  HCG, SERUM, QUALITATIVE    EKG  EKG Interpretation None       Radiology No results found.  Procedures Procedures (including critical care time)  Medications Ordered in ED Medications - No data to display   Initial Impression / Assessment and Plan / ED Course  I have reviewed the triage vital signs and the nursing  notes.  Pertinent labs & imaging results that were available during my care of the patient were reviewed by me and considered in my medical decision making (see chart for details).     Overall well-appearing.  Repeat abdominal exam without tenderness.  Labs and urine without significant abnormality.  Primary care follow-up.  Final Clinical Impressions(s) / ED Diagnoses   Final diagnoses:  Left lower quadrant pain    New Prescriptions New Prescriptions   No medications on file     Azalia Bilisampos, Courteny Egler, MD 11/04/16 [phone]

## 2017-11-11 IMAGING — CT CT ABD-PELV W/ CM
2 of 4 series · 15 of 46 positions shown, 17 images · IV contrast (APPLIED)
Comparison: Mild subsegmental atelectasis seen dependently within
the visualized lung bases.

CLINICAL DATA: Initial evaluation for acute right lower quadrant
abdominal pain. Nausea.

EXAM:
CT ABDOMEN AND PELVIS WITH CONTRAST
TECHNIQUE: Multidetector CT imaging of the abdomen and pelvis was performed
using the standard protocol following bolus administration of
intravenous contrast.
CONTRAST:  100mL J2PBE7-CQQ IOPAMIDOL (J2PBE7-CQQ) INJECTION 61%

[Series 2: axial st · axial · 0.80mm/px · z∈[-458,-8]mm · 12 of 104 slices shown, 14 images]
[im 9/104  soft-tissue]
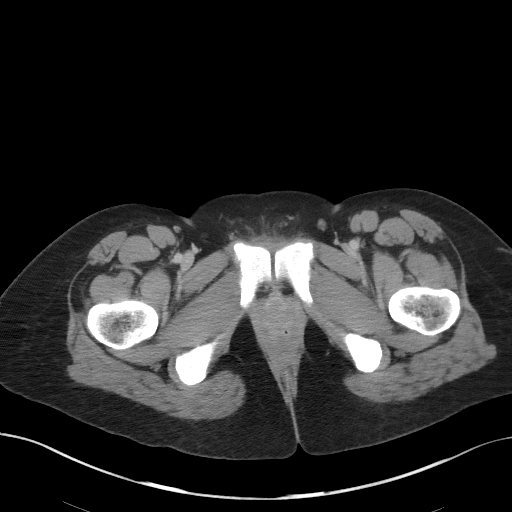
[im 9/104  bone]
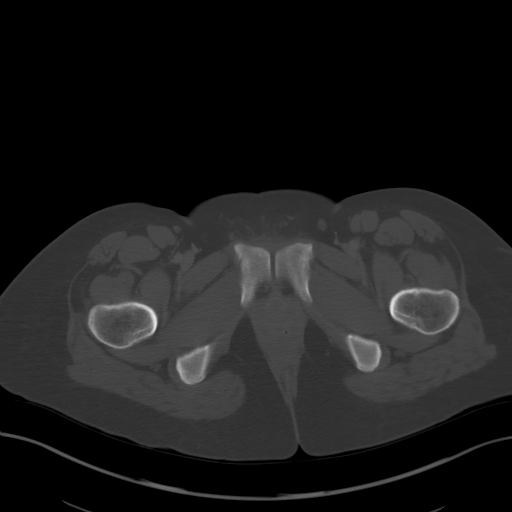
[im 17/104  soft-tissue]
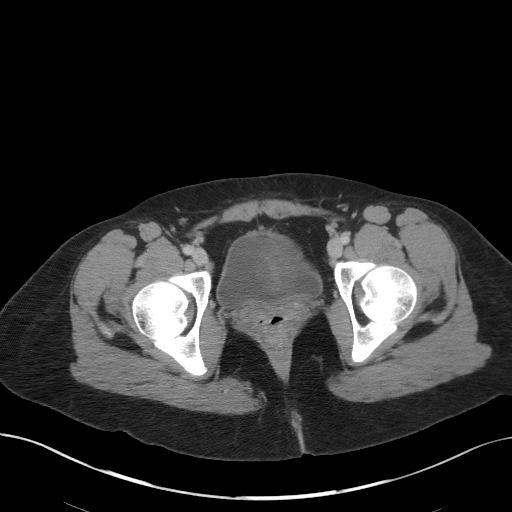
[im 25/104  soft-tissue]
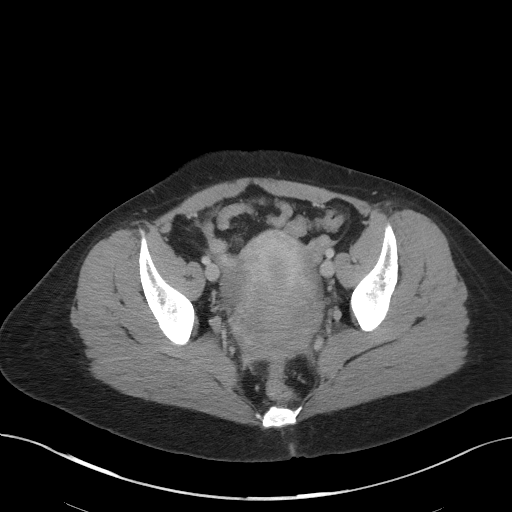
[im 33/104  soft-tissue]
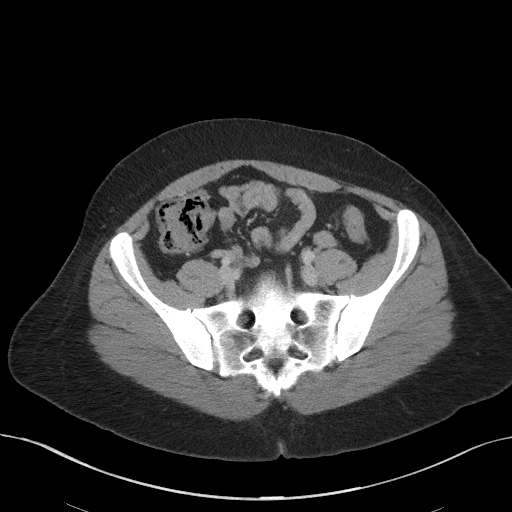
[im 42/104  soft-tissue]
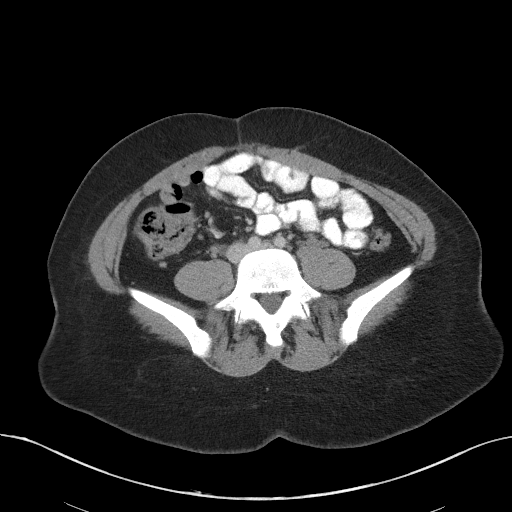
[im 50/104  soft-tissue]
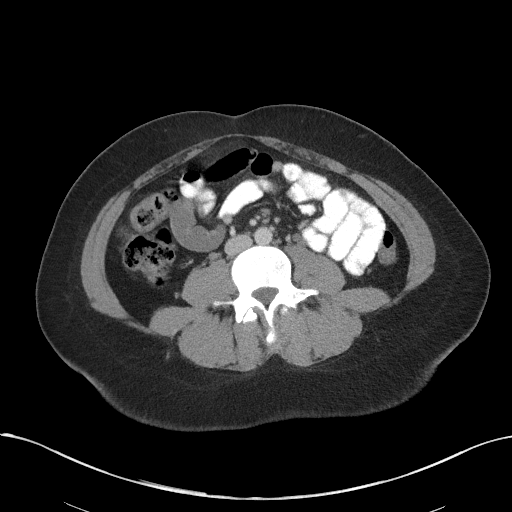
[im 58/104  soft-tissue]
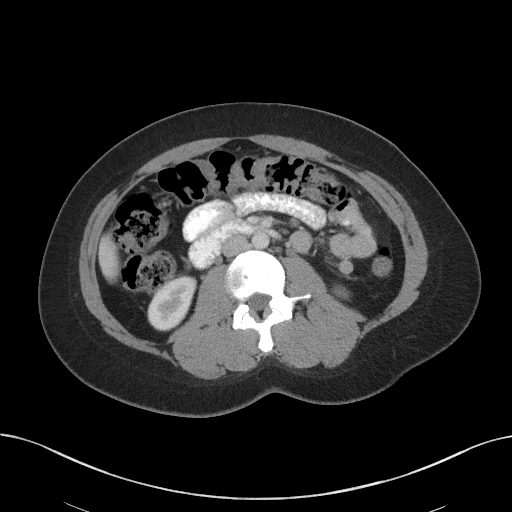
[im 66/104  soft-tissue]
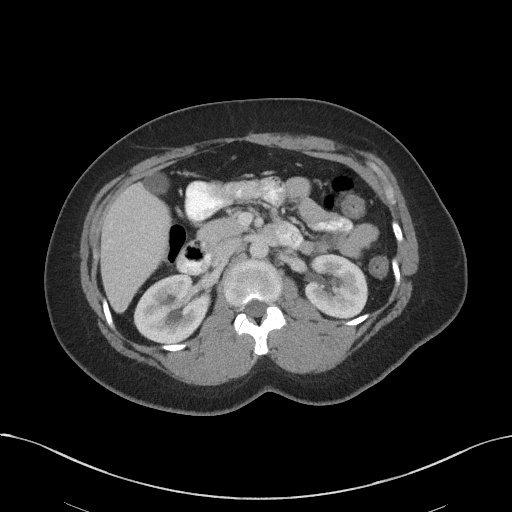
[im 75/104  soft-tissue]
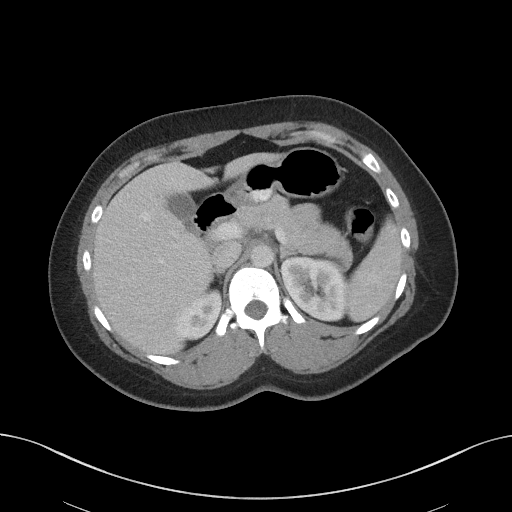
[im 75/104  bone]
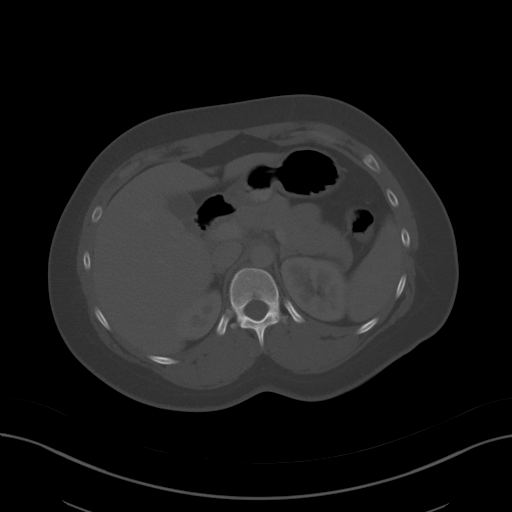
[im 83/104  soft-tissue]
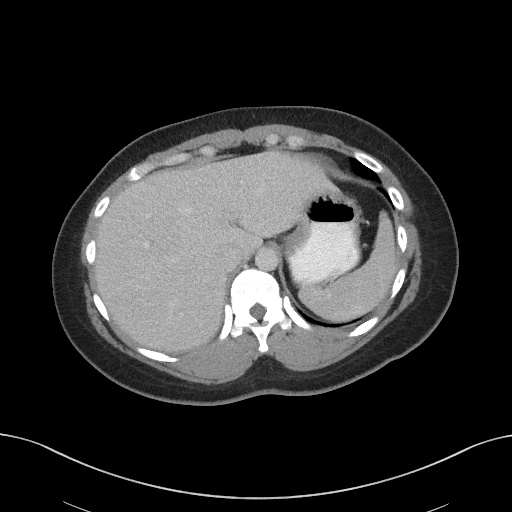
[im 91/104  soft-tissue]
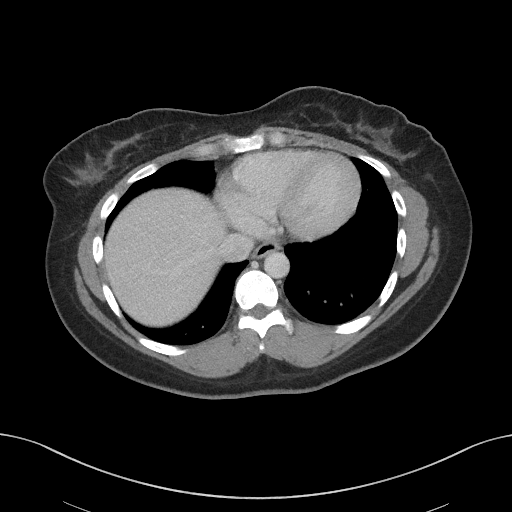
[im 99/104  soft-tissue]
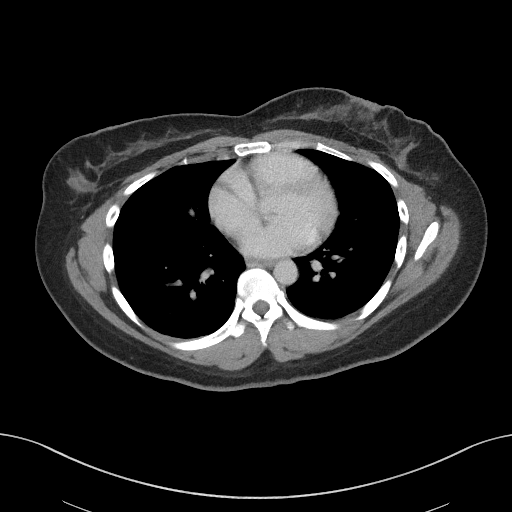

[Series 5: coronal st · coronal · 0.73mm/px · 3 of 80 slices shown]
[im 27/80  soft-tissue]
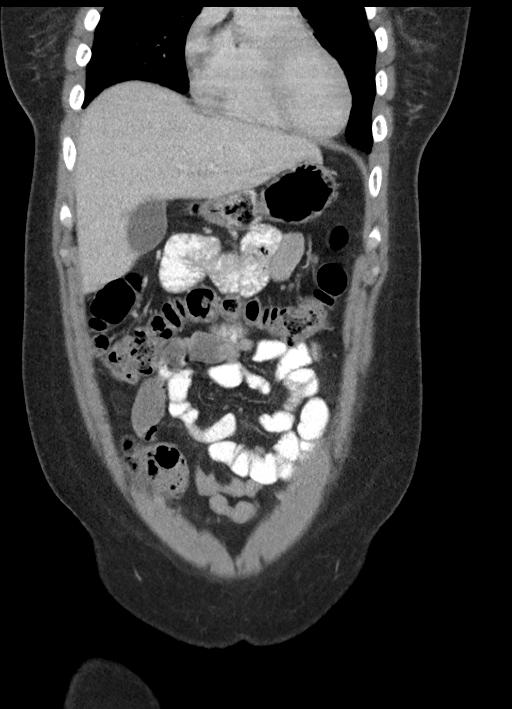
[im 36/80  soft-tissue]
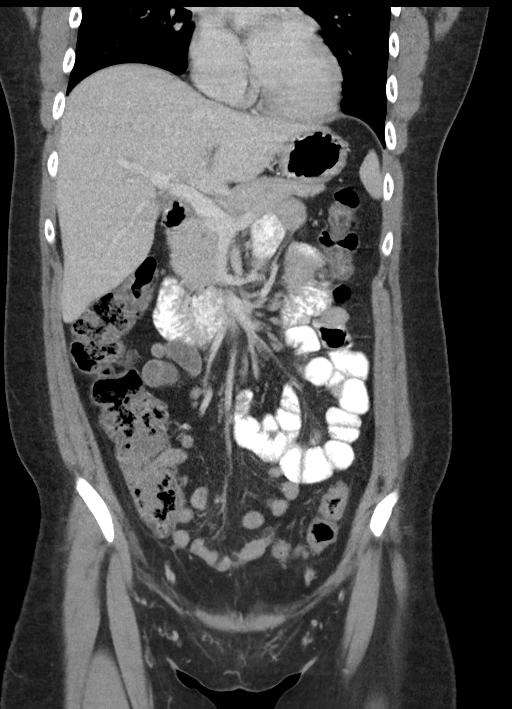
[im 44/80  soft-tissue]
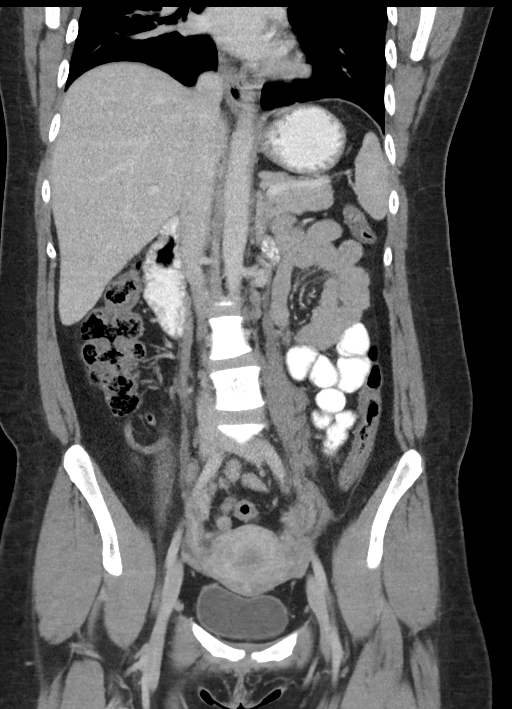

[15 of 46 positions shown; findings below may reference images not displayed]

Visualized lungs are otherwise clear.

Liver demonstrates a normal contrast enhanced appearance.
Gallbladder within normal limits. No biliary dilatation. Spleen,
adrenal glands, and pancreas within normal limits.

Kidneys are equal in size with symmetric enhancement. 3 mm
nonobstructive stone present within the upper pole left kidney.
Subcentimeter hypodensity within the left kidney noted, too small
the characterize, but statistically likely reflects a small cyst. No
hydronephrosis. No focal enhancing renal mass.

Stomach within normal limits. No evidence for bowel obstruction.
Appendix well visualized in the right lower quadrant and is of
normal caliber and appearance associated inflammatory changes to
suggest acute appendicitis. No abnormal wall thickening, mucosal
enhancement, or inflammatory fat stranding seen about the bowels.

Bladder partially distended without acute abnormality. Uterus and
ovaries within normal limits for patient age.

No free intraperitoneal air. No free fluid. No pathologically
enlarged intra-abdominal or pelvic lymph nodes. Normal intravascular
enhancement seen throughout the intra-abdominal aorta and its branch
vessels.

No acute osseous abnormality. No worrisome lytic or blastic osseous
lesions.
FINDINGS: 1. No CT evidence for acute intra-abdominal or pelvic process.
2. Normal appendix.
3. 3 mm nonobstructive left renal calculus.

## 2017-12-21 IMAGING — DX DG CHEST 2V
2 series · 2 of 2 positions shown · non-contrast
Comparison: None.

CLINICAL DATA: Fever, shortness of breath, and cough since this
morning. Current smoker.

EXAM:
CHEST  2 VIEW

[chest pa]
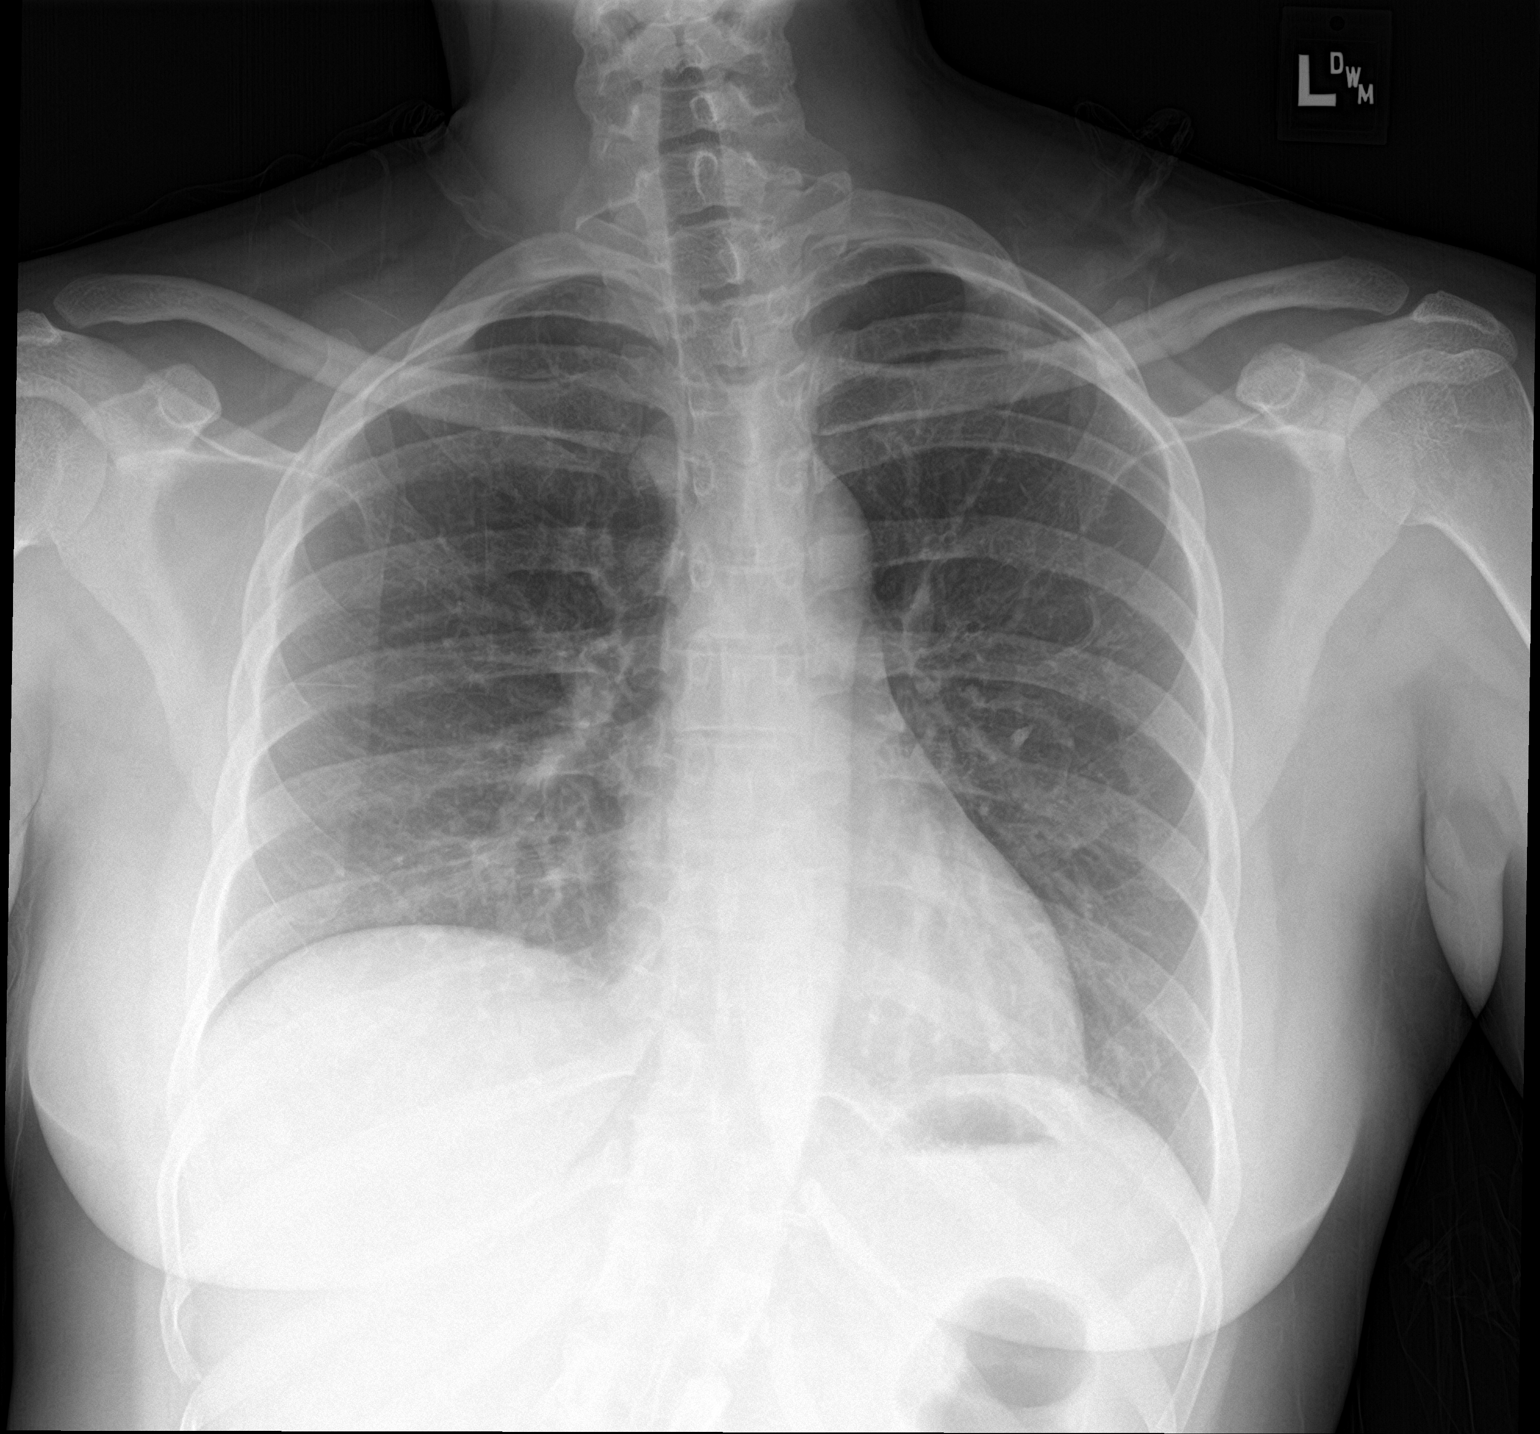

[chest lat]
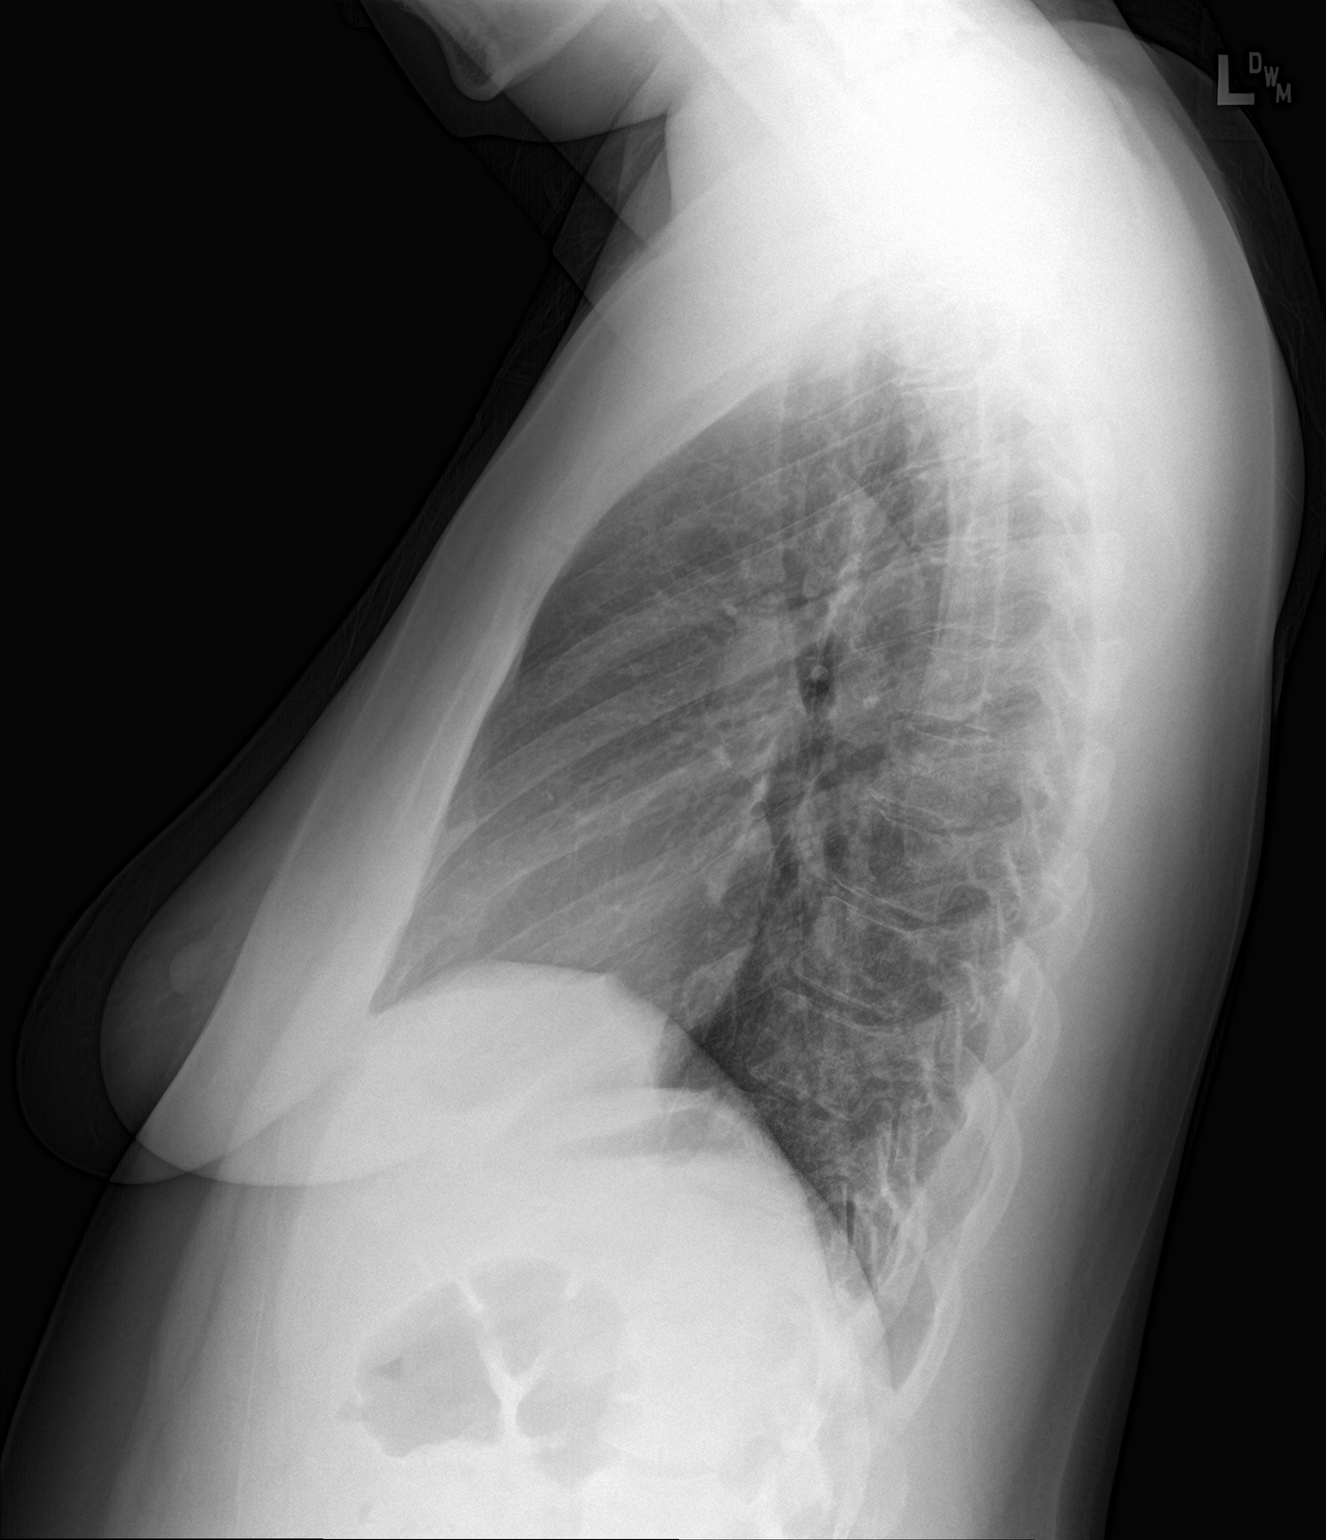

[2 of 2 positions shown; findings below may reference images not displayed]

FINDINGS: The heart size and mediastinal contours are within normal limits.
Both lungs are clear. The visualized skeletal structures are
unremarkable.
IMPRESSION: No active cardiopulmonary disease.

## 2018-06-11 ENCOUNTER — Other Ambulatory Visit: Payer: Self-pay

## 2018-06-11 ENCOUNTER — Emergency Department (HOSPITAL_BASED_OUTPATIENT_CLINIC_OR_DEPARTMENT_OTHER)
Admission: EM | Admit: 2018-06-11 | Discharge: 2018-06-12 | Disposition: A | Discharge status: Home / Self Care | Payer: 59 | Attending: Emergency Medicine | Admitting: Emergency Medicine

## 2018-06-11 ENCOUNTER — Encounter (HOSPITAL_BASED_OUTPATIENT_CLINIC_OR_DEPARTMENT_OTHER): Payer: Self-pay | Admitting: *Deleted

## 2018-06-11 DIAGNOSIS — Z79899 Other long term (current) drug therapy: Secondary | ICD-10-CM | POA: Insufficient documentation

## 2018-06-11 DIAGNOSIS — J029 Acute pharyngitis, unspecified: Secondary | ICD-10-CM | POA: Diagnosis present

## 2018-06-11 DIAGNOSIS — F17210 Nicotine dependence, cigarettes, uncomplicated: Secondary | ICD-10-CM | POA: Diagnosis not present

## 2018-06-11 LAB — GROUP A STREP BY PCR: GROUP A STREP BY PCR: NOT DETECTED

## 2018-06-11 NOTE — ED Provider Notes (Signed)
[facility] Provider Note: Lowella Dell, MD, FACEP  CSN: [id number] MRN: [id number] ARRIVAL: 06/11/18 at 2314 ROOM: [facility]   CHIEF COMPLAINT  Sore Throat   HISTORY OF PRESENT ILLNESS  06/11/18 11:28 PM Renee Mcdonald is a 33 y.o. female with a 1 hour history of throat discomfort.  She describes it as a burning sensation, worse with swallowing and a sensation of difficulty swallowing and breathing.  Symptoms have improved since onset.  She rates her discomfort as a 5 out of 10.  She denies fever, nasal congestion, cough, shortness of breath, vomiting or diarrhea.   Past Medical History:  Diagnosis Date  . Depression     Past Surgical History:  Procedure Laterality Date  . ANKLE SURGERY    . TUBAL LIGATION      No family history on file.  Social History   Tobacco Use  . Smoking status: Current Every Day Smoker    Packs/day: 0.50    Types: Cigarettes  . Smokeless tobacco: Never Used  Substance Use Topics  . Alcohol use: Yes    Comment: occ  . Drug use: No    Prior to Admission medications   Medication Sig Start Date End Date Taking? Authorizing Provider  busPIRone (BUSPAR) 7.5 MG tablet Take 7.5 mg by mouth 3 (three) times daily.    [provider]  dicyclomine (BENTYL) 20 MG tablet Take 1 tablet (20 mg total) by mouth 2 (two) times daily as needed for spasms. 10/26/16   Long, Arlyss Repress, MD  sertraline (ZOLOFT) 100 MG tablet Take 100 mg by mouth daily.    [provider]    Allergies Patient has no known allergies.   REVIEW OF SYSTEMS  Negative except as noted here or in the History of Present Illness.   PHYSICAL EXAMINATION  Initial Vital Signs Blood pressure 124/81, pulse 83, temperature 98.5 F (36.9 C), temperature source Oral, resp. rate 18, height 5\' 8"  (1.727 m), weight 72.6 kg, last menstrual period 05/07/2018, SpO2 100 %.  Examination General: Well-developed, well-nourished female in no acute distress;  appearance consistent with age of record HENT: normocephalic; atraumatic; no pharyngeal erythema, edema or exudate; no trismus; uvula midline; no stridor; no dysphonia Eyes: pupils equal, round and reactive to light; extraocular muscles intact Neck: supple; no lymphadenopathy Heart: regular rate and rhythm Lungs: clear to auscultation bilaterally Abdomen: soft; nondistended; nontender; bowel sounds present Extremities: No deformity; full range of motion; pulses normal Neurologic: Awake, alert and oriented; motor function intact in all extremities and symmetric; no facial droop Skin: Warm and dry Psychiatric: Normal mood and affect   RESULTS  Summary of this visit's results, reviewed by myself:   EKG Interpretation  Date/Time:    Ventricular Rate:    PR Interval:    QRS Duration:   QT Interval:    QTC Calculation:   R Axis:     Text Interpretation:        Laboratory Studies: Results for orders placed or performed during the hospital encounter of 06/11/18 (from the past 24 hour(s))  Group A Strep by PCR     Status: None   Collection Time: 06/11/18 11:25 PM  Result Value Ref Range   Group A Strep by PCR NOT DETECTED NOT DETECTED   Imaging Studies: No results found.  ED COURSE and MDM  Nursing notes and initial vitals signs, including pulse oximetry, reviewed.  Vitals:   06/11/18 2321 06/11/18 2327  BP:  124/81  Pulse:  83  Resp:  18  Temp:  98.5 F (36.9 C)  TempSrc:  Oral  SpO2:  100%  Weight: 72.6 kg   Height: 5\' 8"  (1.727 m)     PROCEDURES    ED DIAGNOSES     ICD-10-CM   1. Sore throat J02.9        Haylo Fake, MD 06/12/18 [phone]

## 2018-06-11 NOTE — ED Triage Notes (Signed)
Pt reports sore throat x 1 hour. Denies fever and other respiratory Sx

## 2018-06-11 NOTE — ED Notes (Signed)
ED Provider at bedside. 

## 2018-07-26 ENCOUNTER — Emergency Department (HOSPITAL_BASED_OUTPATIENT_CLINIC_OR_DEPARTMENT_OTHER)
Admission: EM | Admit: 2018-07-26 | Discharge: 2018-07-26 | Disposition: A | Discharge status: Home / Self Care | Payer: 59 | Attending: Emergency Medicine | Admitting: Emergency Medicine

## 2018-07-26 ENCOUNTER — Encounter (HOSPITAL_BASED_OUTPATIENT_CLINIC_OR_DEPARTMENT_OTHER): Payer: Self-pay | Admitting: Emergency Medicine

## 2018-07-26 ENCOUNTER — Other Ambulatory Visit: Payer: Self-pay

## 2018-07-26 ENCOUNTER — Emergency Department (HOSPITAL_BASED_OUTPATIENT_CLINIC_OR_DEPARTMENT_OTHER): Payer: 59

## 2018-07-26 DIAGNOSIS — F17210 Nicotine dependence, cigarettes, uncomplicated: Secondary | ICD-10-CM | POA: Diagnosis not present

## 2018-07-26 DIAGNOSIS — F419 Anxiety disorder, unspecified: Secondary | ICD-10-CM | POA: Diagnosis not present

## 2018-07-26 DIAGNOSIS — R0789 Other chest pain: Secondary | ICD-10-CM

## 2018-07-26 DIAGNOSIS — F329 Major depressive disorder, single episode, unspecified: Secondary | ICD-10-CM | POA: Diagnosis not present

## 2018-07-26 DIAGNOSIS — R0602 Shortness of breath: Secondary | ICD-10-CM | POA: Diagnosis not present

## 2018-07-26 DIAGNOSIS — R079 Chest pain, unspecified: Secondary | ICD-10-CM | POA: Diagnosis present

## 2018-07-26 MED ORDER — HYDROXYZINE HCL 25 MG PO TABS: 25.0000 mg | ORAL_TABLET | Freq: Three times a day (TID) | ORAL | 0 refills | Status: AC | PRN

## 2018-07-26 MED ORDER — HYDROXYZINE HCL 25 MG PO TABS
25.0000 mg | ORAL_TABLET | Freq: Once | ORAL | Status: AC
  Administered 2018-07-26: 25 mg via ORAL
  Filled 2018-07-26: qty 1

## 2018-07-26 MED FILL — HYDROXYZINE PAM 25 MG CAP: 25 | 10 days supply | Qty: 30 | Fill #0

## 2018-07-26 NOTE — Discharge Instructions (Signed)
Your chest x-ray and EKG look good today, I suspect your symptoms are related to anxiety, you can take hydroxyzine 3 times daily as needed for anxiety.  Please follow-up with your regular doctor for continued management of this.  If you develop fevers, cough, worsening chest pain or shortness of breath, feel lightheaded or like you may pass out or develop any other new or concerning symptoms please return to the ED for reevaluation.

## 2018-07-26 NOTE — ED Provider Notes (Signed)
[facility] EMERGENCY DEPARTMENT Provider Note   CSN: [id number] Arrival date & time: 07/26/18  1050    History   Chief Complaint Chief Complaint  Patient presents with  . Anxiety  . Chest Pain    HPI Renee Mcdonald is a 33 y.o. female.     Renee Mcdonald is a 33 y.o. female with a history of depression and anxiety, who presents to the emergency department for evaluation of a month of intermittent chest tightness and shortness of breath.  She reports symptoms tend to come on when she feels anxious or more worrisome.  She reports that she has had a long history of anxiety, was previously on medication for it but has not been on any medication for multiple years.  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  She reports that she has had some intermittent episodes of chest tightness and shortness of breath.  She reports the symptoms seem to worsen when she lays down to go to sleep at night, she reports that she feels almost claustrophobic in her room.  Symptoms seem to go away as she tries to calm herself down and cope with her anxiety.  She mentioned her shortness of breath at work and they recommended that she come in for evaluation given current pandemic.  She has not had any associated fevers, cough, congestion.  She has no lower extremity edema, no persistent shortness of breath, no history of PE or DVT she is not on any hormone therapy, denies any recent travel or surgery.  No cardiac history.  Denies smoking or substance use.  Denies any other aggravating or alleviating factors.     Past Medical History:  Diagnosis Date  . Depression     There are no active problems to display for this patient.   Past Surgical History:  Procedure Laterality Date  .  ANKLE SURGERY    . TUBAL LIGATION       OB History   No obstetric history on file.      Home Medications    Prior to Admission medications   Medication Sig Start Date End Date Taking? Authorizing Provider  hydrOXYzine (ATARAX/VISTARIL) 25 MG tablet Take 1 tablet (25 mg total) by mouth 3 (three) times daily as needed for anxiety. 07/26/18   Dartha Lodge, PA-C    Family History No family history on file.  Social History Social History   Tobacco Use  . Smoking status: Current Every Day Smoker    Packs/day: 0.50    Types: Cigarettes  . Smokeless tobacco: Never Used  Substance Use Topics  . Alcohol use: Yes    Comment: occ  . Drug use: No     Allergies   Patient has no known allergies.   Review of Systems Review of Systems  Constitutional: Negative for chills and fever.  HENT: Negative.   Eyes: Negative for visual disturbance.  Respiratory: Positive for chest tightness and shortness of breath. Negative for cough.   Cardiovascular: Positive for chest pain. Negative for palpitations and leg swelling.  Gastrointestinal: Negative for abdominal pain, nausea and vomiting.  Musculoskeletal: Negative for arthralgias and myalgias.  Skin: Negative for color change and Mcdonald.  Neurological: Negative for dizziness, syncope and light-headedness.  Psychiatric/Behavioral: The patient is nervous/anxious.  Physical Exam Updated Vital Signs BP (!) 128/99 (BP Location: Left Arm)   Pulse 90   Temp 98.5 F (36.9 C) (Oral)   Resp 18   Ht  (1.702 m)   Wt 72.6 kg   LMP 07/16/2018   SpO2 99%   BMI 25.06 kg/m   Physical Exam Vitals signs and nursing note reviewed.  Constitutional:      General: She is not in acute distress.    Appearance: She is well-developed and normal weight. She is not ill-appearing or diaphoretic.     Comments: Patient appears anxious but is well-appearing and in no acute distress.  HENT:     Head: Normocephalic and atraumatic.  Eyes:      General:        Right eye: No discharge.        Left eye: No discharge.     Pupils: Pupils are equal, round, and reactive to light.  Neck:     Musculoskeletal: Neck supple.     Vascular: No JVD.     Trachea: No tracheal deviation.  Cardiovascular:     Rate and Rhythm: Normal rate and regular rhythm.     Pulses:          Radial pulses are 2+ on the right side and 2+ on the left side.     Heart sounds: Normal heart sounds. No murmur. No friction rub. No gallop.   Pulmonary:     Effort: Pulmonary effort is normal. No respiratory distress.     Breath sounds: Normal breath sounds. No wheezing or rales.     Comments: Respirations equal and unlabored, patient able to speak in full sentences, lungs clear to auscultation bilaterally Chest:     Chest wall: No tenderness.  Abdominal:     General: Bowel sounds are normal. There is no distension.     Palpations: Abdomen is soft. There is no mass.     Tenderness: There is no abdominal tenderness. There is no guarding.     Comments: Abdomen soft, nondistended, nontender to palpation in all quadrants without guarding or peritoneal signs  Musculoskeletal:        General: No deformity.     Right lower leg: She exhibits no tenderness. No edema.     Left lower leg: She exhibits no tenderness. No edema.     Comments: Bilateral lower extremities without tenderness or edema.  Skin:    General: Skin is warm and dry.     Capillary Refill: Capillary refill takes less than 2 seconds.  Neurological:     Mental Status: She is alert and oriented to person, place, and time.     Coordination: Coordination normal.     Comments: Speech is clear, able to follow commands Moves extremities without ataxia, coordination intact  Psychiatric:        Mood and Affect: Mood is anxious.        Behavior: Behavior normal.      ED Treatments / Results  Labs (all labs ordered are listed, but only abnormal results are displayed) Labs Reviewed - No data to display   EKG EKG Interpretation  Date/Time:  Tuesday Jul 26 2018 11:04:24 EDT Ventricular Rate:  93 PR Interval:    QRS Duration: 81 QT Interval:  365 QTC Calculation: 454 R Axis:   77 Text Interpretation:  Sinus rhythm Borderline T wave abnormalities Baseline wander in lead(s) II III aVF No previous ECGs available Confirmed by Vanetta Mulders [phone]) on 07/26/2018 11:16:11 AM  Radiology Dg Chest Port 1 View  Result Date: 07/26/2018 CLINICAL DATA:  Chest pain for several days. EXAM: PORTABLE CHEST 1 VIEW COMPARISON:  PA and lateral chest 03/10/2018 and 12/09/2015. FINDINGS: The lungs are clear. Heart size is normal. No pneumothorax or pleural fluid. No bony abnormality. IMPRESSION: Normal chest. Electronically Signed   By: Drusilla Kannerhomas  Dalessio M.D.   On: 07/26/2018 12:12    Procedures Procedures (including critical care time)  Medications Ordered in ED Medications  hydrOXYzine (ATARAX/VISTARIL) tablet 25 mg (25 mg Oral Given 07/26/18 1119)     Initial Impression / Assessment and Plan / ED Course  I have reviewed the triage vital signs and the nursing notes.  Pertinent labs & imaging results that were available during my care of the patient were reviewed by me and considered in my medical decision making (see chart for details).  Patient presents with 1 month of intermittent chest tightness and shortness of breath, she specifically reports that the symptoms come on when she is feeling nervous or anxious but she has had them with increasing frequency over the past month.  She reports that she has had worsened anxiety due to the current coronavirus pandemic.  She has struggled with anxiety for many years, was previously on medication but has been off meds for a few years now.  She has not had any associated fevers or cough.  She is PERC negative with no risk factors for PE.  She has no cardiac history, is currently chest pain-free.  I have low suspicion for cardiac or pulmonary etiology for patient's  chest pain and shortness of breath and feel this is much more likely related to her anxiety.  She has normal vitals here.  She was asked to come in for evaluation by her work in setting of current pandemic but I do not feel that she has symptoms suggestive of COVID-19 infection.  Her chest x-ray and EKG are reassuring, her symptoms have significantly improved with hydroxyzine given here in the emergency department.  This further supports that symptoms are more than likely due to anxiety.  Will discharge her with prescription of hydroxyzine, I have encouraged her to follow-up with PCP for continued management of anxiety.  Strict return precautions provided.  Work note given to allow patient to return to work.  She expresses understanding and agreement.  Discharged home in good condition.  Final Clinical Impressions(s) / ED Diagnoses   Final diagnoses:  Anxiety  SOB (shortness of breath)  Chest tightness    ED Discharge Orders         Ordered    hydrOXYzine (ATARAX/VISTARIL) 25 MG tablet  3 times daily PRN     07/26/18 1236           Dartha LodgeFord, Elmire Amrein N, [place] JerseyPA-C 07/26/18 1304    Vanetta MuldersZackowski, Scott, MD 07/27/18 [phone]

## 2018-07-26 NOTE — ED Triage Notes (Signed)
Pt reports she has been dealing with anxiety for "awhile" but does not take medication. She states for the past several days when she lays down she has chest pain.

## 2018-07-26 NOTE — ED Notes (Signed)
ED Provider at bedside. Kelsey PA 

## 2018-07-26 NOTE — ED Notes (Signed)
C/o CP x 1 month. Pt denies fever, cough, states she has hx of anxiety.

## 2020-03-20 ENCOUNTER — Other Ambulatory Visit: Payer: Self-pay

## 2020-03-20 ENCOUNTER — Encounter (HOSPITAL_BASED_OUTPATIENT_CLINIC_OR_DEPARTMENT_OTHER): Payer: Self-pay | Admitting: *Deleted

## 2020-03-20 DIAGNOSIS — F17210 Nicotine dependence, cigarettes, uncomplicated: Secondary | ICD-10-CM | POA: Insufficient documentation

## 2020-03-20 DIAGNOSIS — N739 Female pelvic inflammatory disease, unspecified: Secondary | ICD-10-CM | POA: Insufficient documentation

## 2020-03-20 LAB — CBC WITH DIFFERENTIAL/PLATELET
Abs Immature Granulocytes: 0.01 10*3/uL (ref 0.00–0.07)
Basophils Absolute: 0 10*3/uL (ref 0.0–0.1)
Basophils Relative: 0 %
Eosinophils Absolute: 0 10*3/uL (ref 0.0–0.5)
Eosinophils Relative: 0 %
HCT: 40.8 % (ref 36.0–46.0)
Hemoglobin: 12.6 g/dL (ref 12.0–15.0)
Immature Granulocytes: 0 %
Lymphocytes Relative: 34 %
Lymphs Abs: 1.9 10*3/uL (ref 0.7–4.0)
MCH: 26.9 pg (ref 26.0–34.0)
MCHC: 30.9 g/dL (ref 30.0–36.0)
MCV: 87.2 fL (ref 80.0–100.0)
Monocytes Absolute: 0.4 10*3/uL (ref 0.1–1.0)
Monocytes Relative: 7 %
Neutro Abs: 3.3 10*3/uL (ref 1.7–7.7)
Neutrophils Relative %: 59 %
Platelets: 262 10*3/uL (ref 150–400)
RBC: 4.68 MIL/uL (ref 3.87–5.11)
RDW: 13.2 % (ref 11.5–15.5)
WBC: 5.5 10*3/uL (ref 4.0–10.5)
nRBC: 0 % (ref 0.0–0.2)

## 2020-03-20 LAB — PREGNANCY, URINE: Preg Test, Ur: NEGATIVE

## 2020-03-20 LAB — COMPREHENSIVE METABOLIC PANEL
ALT: 10 U/L (ref 0–44)
AST: 17 U/L (ref 15–41)
Albumin: 4.5 g/dL (ref 3.5–5.0)
Alkaline Phosphatase: 69 U/L (ref 38–126)
Anion gap: 9 (ref 5–15)
BUN: 11 mg/dL (ref 6–20)
CO2: 26 mmol/L (ref 22–32)
Calcium: 9.1 mg/dL (ref 8.9–10.3)
Chloride: 103 mmol/L (ref 98–111)
Creatinine, Ser: 0.68 mg/dL (ref 0.44–1.00)
GFR, Estimated: >60 mL/min (ref >60)
Glucose, Bld: 123 mg/dL — ABNORMAL HIGH (ref 70–99)
Potassium: 3.5 mmol/L (ref 3.5–5.1)
Sodium: 138 mmol/L (ref 135–145)
Total Bilirubin: 0.5 mg/dL (ref 0.3–1.2)
Total Protein: 7.4 g/dL (ref 6.5–8.1)

## 2020-03-20 LAB — URINALYSIS, ROUTINE W REFLEX MICROSCOPIC
Bilirubin Urine: NEGATIVE
Glucose, UA: NEGATIVE mg/dL
Hgb urine dipstick: NEGATIVE
Ketones, ur: NEGATIVE mg/dL
Leukocytes,Ua: NEGATIVE
Nitrite: NEGATIVE
Protein, ur: NEGATIVE mg/dL
Specific Gravity, Urine: 1.025 (ref 1.005–1.030)
pH: 6 (ref 5.0–8.0)

## 2020-03-20 LAB — LIPASE, BLOOD: Lipase: 30 U/L (ref 11–51)

## 2020-03-20 NOTE — ED Triage Notes (Signed)
C/o left lower abd pain x 2 days, denies n/v/d

## 2020-03-21 ENCOUNTER — Emergency Department (HOSPITAL_BASED_OUTPATIENT_CLINIC_OR_DEPARTMENT_OTHER)
Admission: EM | Admit: 2020-03-21 | Discharge: 2020-03-21 | Disposition: A | Discharge status: Home / Self Care | Payer: PRIVATE HEALTH INSURANCE | Attending: Emergency Medicine | Admitting: Emergency Medicine

## 2020-03-21 DIAGNOSIS — N739 Female pelvic inflammatory disease, unspecified: Secondary | ICD-10-CM

## 2020-03-21 LAB — WET PREP, GENITAL
Sperm: NONE SEEN
Trich, Wet Prep: NONE SEEN
Yeast Wet Prep HPF POC: NONE SEEN

## 2020-03-21 LAB — HIV ANTIBODY (ROUTINE TESTING W REFLEX): HIV Screen 4th Generation wRfx: NONREACTIVE

## 2020-03-21 LAB — RPR: RPR Ser Ql: NONREACTIVE

## 2020-03-21 MED ORDER — CEFTRIAXONE SODIUM 500 MG IJ SOLR
500.0000 mg | Freq: Once | INTRAMUSCULAR | Status: AC
  Administered 2020-03-21: 02:00:00 500 mg via INTRAMUSCULAR
  Filled 2020-03-21: qty 500

## 2020-03-21 MED ORDER — METRONIDAZOLE 500 MG PO TABS
500.0000 mg | ORAL_TABLET | Freq: Once | ORAL | Status: AC
  Administered 2020-03-21: 02:00:00 500 mg via ORAL
  Filled 2020-03-21: qty 1

## 2020-03-21 MED ORDER — DOXYCYCLINE HYCLATE 100 MG PO TABS
100.0000 mg | ORAL_TABLET | Freq: Once | ORAL | Status: AC
  Administered 2020-03-21: 02:00:00 100 mg via ORAL
  Filled 2020-03-21: qty 1

## 2020-03-21 MED ORDER — METRONIDAZOLE 500 MG PO TABS: 500.0000 mg | ORAL_TABLET | Freq: Two times a day (BID) | ORAL | 0 refills | Status: AC

## 2020-03-21 MED ORDER — DOXYCYCLINE HYCLATE 100 MG PO CAPS: 100.0000 mg | ORAL_CAPSULE | Freq: Two times a day (BID) | ORAL | 0 refills | Status: AC

## 2020-03-21 NOTE — Discharge Instructions (Signed)
Take ibuprofen and/or acetaminophen as needed for pain.  Return if pain is getting worse, you start running a high fever, or start vomiting.  Otherwise, follow-up with the gynecologist.

## 2020-03-21 NOTE — ED Provider Notes (Signed)
[facility] EMERGENCY DEPARTMENT Provider Note   CSN: [id number] Arrival date & time: 03/20/20  1855   History Chief Complaint  Patient presents with  . Abdominal Pain    Renee Mcdonald is a 34 y.o. female.  The history is provided by the patient.  Abdominal Pain She is status post tubal ligation and comes in complaining of lower abdominal pain since yesterday evening.  Pain is moderate in intensity and she rates it at 6/10.  It is better if she standing, worse if she is sitting or lying.  There is no associated nausea, vomiting, diarrhea.  She denies any urinary urgency, frequency, tenesmus, dysuria.  She denies any vaginal bleeding or discharge.  Last menses was 9 days ago and was normal.  She took acetaminophen which did give slight, temporary relief of her pain.  Similar pain in the past occurred during an ectopic pregnancy.  Past Medical History:  Diagnosis Date  . Depression     There are no problems to display for this patient.   Past Surgical History:  Procedure Laterality Date  . ANKLE SURGERY    . TUBAL LIGATION       OB History   No obstetric history on file.     No family history on file.  Social History   Tobacco Use  . Smoking status: Current Every Day Smoker    Packs/day: 0.50    Types: Cigarettes  . Smokeless tobacco: Never Used  Vaping Use  . Vaping Use: Never used  Substance Use Topics  . Alcohol use: Yes    Comment: occ  . Drug use: No    Home Medications Prior to Admission medications   Medication Sig Start Date End Date Taking? Authorizing Provider  hydrOXYzine (ATARAX/VISTARIL) 25 MG tablet Take 1 tablet (25 mg total) by mouth 3 (three) times daily as needed for anxiety. 07/26/18   Dartha Lodge, PA-C    Allergies    Patient has no known allergies.  Review of Systems   Review of Systems  Gastrointestinal: Positive for abdominal pain.  All other systems reviewed and are negative.   Physical Exam Updated Vital  Signs BP (!) 141/92 (BP Location: Right Arm)   Pulse 67   Temp 98.3 F (36.8 C) (Oral)   Resp 18   Ht 5' 7.5" (1.715 m)   Wt 70.3 kg   LMP 03/18/2020   SpO2 100%   BMI 23.92 kg/m   Physical Exam Vitals and nursing note reviewed.   34 year old female, resting comfortably and in no acute distress. Vital signs are significant for borderline elevated blood pressure. Oxygen saturation is 100%, which is normal. Head is normocephalic and atraumatic. PERRLA, EOMI. Oropharynx is clear. Neck is nontender and supple without adenopathy or JVD. Back is nontender and there is no CVA tenderness. Lungs are clear without rales, wheezes, or rhonchi. Chest is nontender. Heart has regular rate and rhythm without murmur. Abdomen is soft, flat, with mild tenderness across the suprapubic area.  There is no rebound or guarding.  There are no masses or hepatosplenomegaly and peristalsis is normoactive. Pelvic: Normal external female genitalia.  Cervix has some slight mucoid drainage.  Fundus is top normal in size.  There is mild cervical motion tenderness and mild bilateral adnexal tenderness.  No adnexal masses are appreciated. Extremities have no cyanosis or edema, full range of motion is present. Skin is warm and dry without rash. Neurologic: Mental status is normal, cranial nerves are intact, there are  no motor or sensory deficits.  ED Results / Procedures / Treatments   Labs (all labs ordered are listed, but only abnormal results are displayed) Labs Reviewed  WET PREP, GENITAL - Abnormal; Notable for the following components:      Result Value   Clue Cells Wet Prep HPF POC PRESENT (*)    WBC, Wet Prep HPF POC MANY (*)    All other components within normal limits  COMPREHENSIVE METABOLIC PANEL - Abnormal; Notable for the following components:   Glucose, Bld 123 (*)    All other components within normal limits  URINALYSIS, ROUTINE W REFLEX MICROSCOPIC  PREGNANCY, URINE  CBC WITH  DIFFERENTIAL/PLATELET  LIPASE, BLOOD  RPR  HIV ANTIBODY (ROUTINE TESTING W REFLEX)  GC/CHLAMYDIA PROBE AMP ([place]) NOT AT [facility]   Procedures Procedures  Medications Ordered in ED Medications  cefTRIAXone (ROCEPHIN) injection 500 mg (500 mg Intramuscular Given 03/21/20 0154)  doxycycline (VIBRA-TABS) tablet 100 mg (100 mg Oral Given 03/21/20 0154)  metroNIDAZOLE (FLAGYL) tablet 500 mg (500 mg Oral Given 03/21/20 0154)    ED Course  I have reviewed the triage vital signs and the nursing notes.  Pertinent lab results that were available during my care of the patient were reviewed by me and considered in my medical decision making (see chart for details).  MDM Rules/Calculators/A&P Pelvic pain of uncertain cause.  Consider ovarian cyst, pelvic inflammatory disease, urinary tract infection.  Doubt diverticulitis.  Pregnancy test is negative and labs are significant only for minimally elevated glucose of 123.  Old records are reviewed, and she has several ED visits for lower abdominal pain.  Pelvic exam is consistent with pelvic inflammatory disease.  She is given an injection of ceftriaxone and a dose of oral metronidazole and oral doxycycline.  Wet prep is significant for clue cells.  She does not clinically have bacterial vaginosis, but this would be adequately treated with the metronidazole that is being given for PID.  She is discharged with prescriptions for doxycycline and metronidazole, follow-up with GYN.  Final Clinical Impression(s) / ED Diagnoses Final diagnoses:  Pelvic inflammatory disease    Rx / DC Orders ED Discharge Orders         Ordered    metroNIDAZOLE (FLAGYL) 500 MG tablet  2 times daily        03/21/20 0227    doxycycline (VIBRAMYCIN) 100 MG capsule  2 times daily        03/21/20 0227           Dione Booze, MD 03/21/20 0230

## 2020-03-24 LAB — GC/CHLAMYDIA PROBE AMP (~~LOC~~) NOT AT ARMC
Chlamydia: NEGATIVE
Comment: NEGATIVE
Comment: NORMAL
Neisseria Gonorrhea: NEGATIVE

## 2020-08-07 IMAGING — DX PORTABLE CHEST - 1 VIEW
1 series · 1 of 1 positions shown · non-contrast
Comparison: PA and lateral chest 03/10/2018 and 12/09/2015.

CLINICAL DATA: Chest pain for several days.

EXAM:
PORTABLE CHEST 1 VIEW

[chest ap]
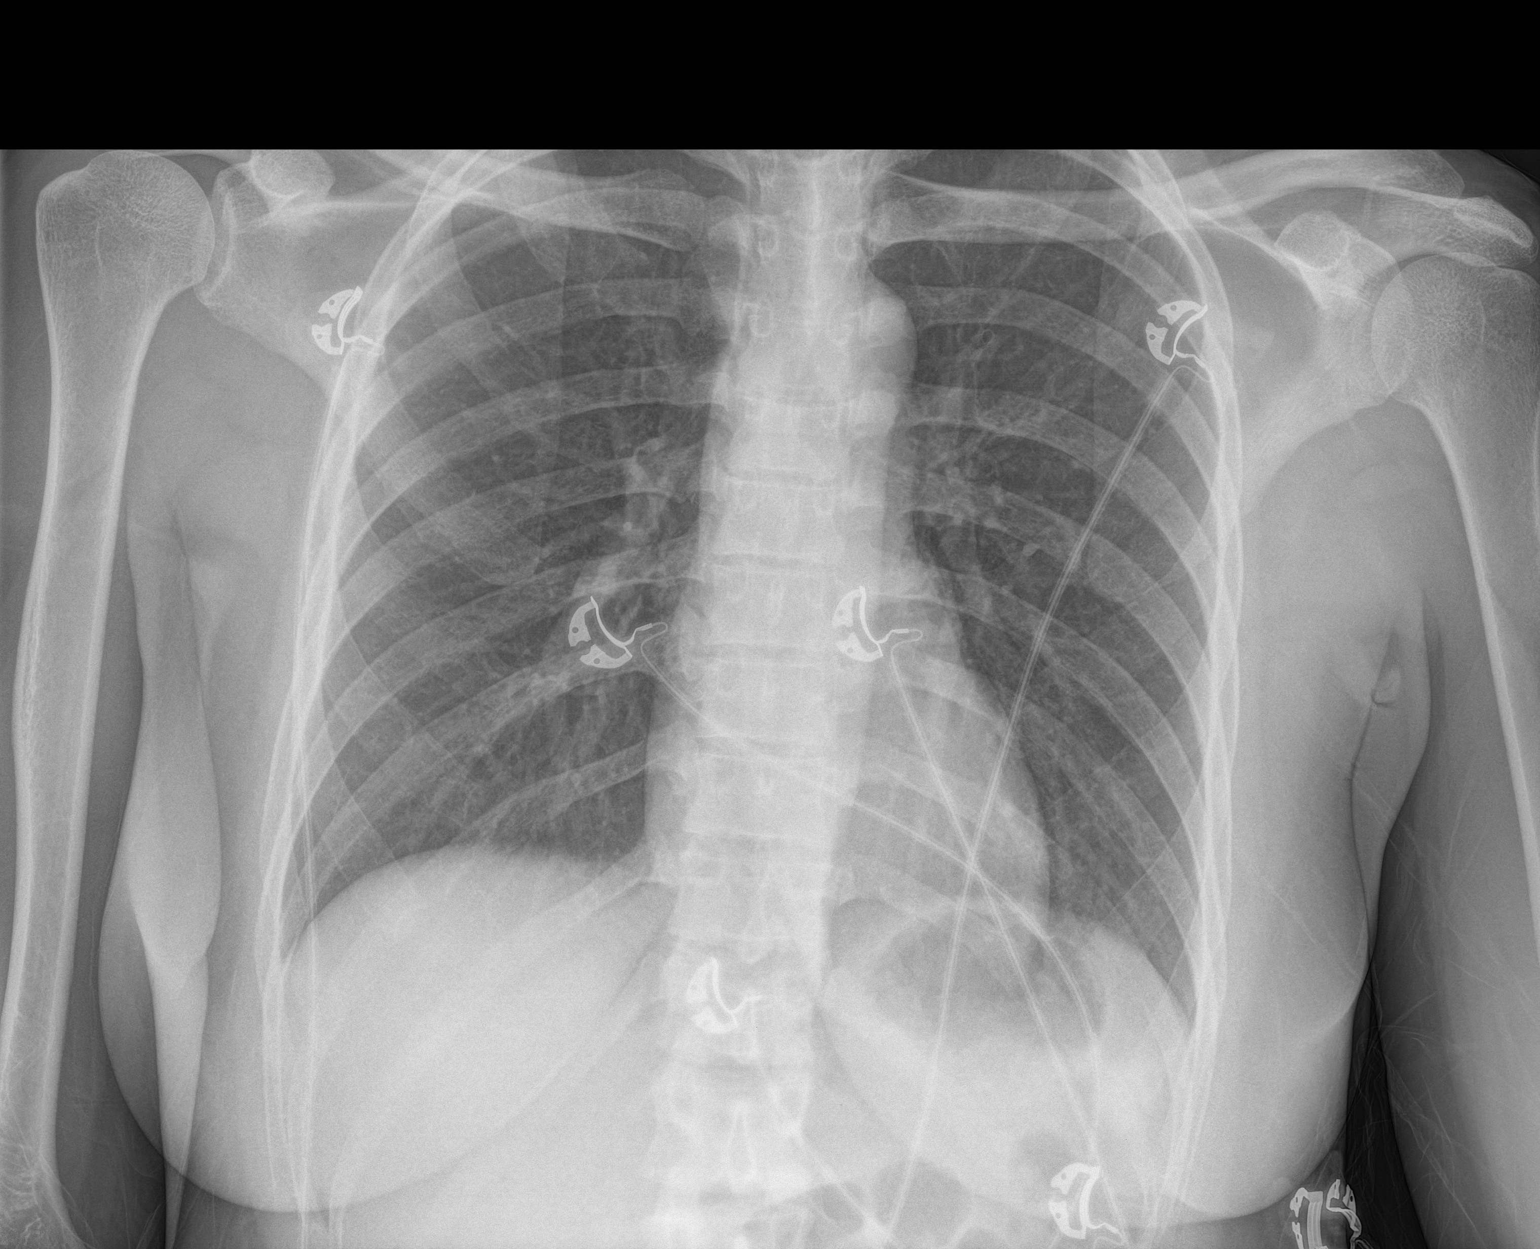

[1 of 1 positions shown; findings below may reference images not displayed]

FINDINGS: The lungs are clear. Heart size is normal. No pneumothorax or
pleural fluid. No bony abnormality.
IMPRESSION: Normal chest.

## 2022-04-03 ENCOUNTER — Encounter (HOSPITAL_BASED_OUTPATIENT_CLINIC_OR_DEPARTMENT_OTHER): Payer: Self-pay | Admitting: Emergency Medicine

## 2022-04-03 ENCOUNTER — Other Ambulatory Visit: Payer: Self-pay

## 2022-04-03 ENCOUNTER — Emergency Department (HOSPITAL_BASED_OUTPATIENT_CLINIC_OR_DEPARTMENT_OTHER): Payer: Medicaid Other

## 2022-04-03 DIAGNOSIS — R202 Paresthesia of skin: Secondary | ICD-10-CM | POA: Insufficient documentation

## 2022-04-03 DIAGNOSIS — I10 Essential (primary) hypertension: Secondary | ICD-10-CM | POA: Diagnosis present

## 2022-04-03 DIAGNOSIS — R030 Elevated blood-pressure reading, without diagnosis of hypertension: Secondary | ICD-10-CM | POA: Insufficient documentation

## 2022-04-03 LAB — BASIC METABOLIC PANEL
Anion gap: 8 (ref 5–15)
BUN: 8 mg/dL (ref 6–20)
CO2: 24 mmol/L (ref 22–32)
Calcium: 8.9 mg/dL (ref 8.9–10.3)
Chloride: 102 mmol/L (ref 98–111)
Creatinine, Ser: 0.67 mg/dL (ref 0.44–1.00)
GFR, Estimated: >60 mL/min (ref >60)
Glucose, Bld: 128 mg/dL — ABNORMAL HIGH (ref 70–99)
Potassium: 3.3 mmol/L — ABNORMAL LOW (ref 3.5–5.1)
Sodium: 134 mmol/L — ABNORMAL LOW (ref 135–145)

## 2022-04-03 LAB — CBC
HCT: 37.8 % (ref 36.0–46.0)
Hemoglobin: 11.7 g/dL — ABNORMAL LOW (ref 12.0–15.0)
MCH: 26.1 pg (ref 26.0–34.0)
MCHC: 31 g/dL (ref 30.0–36.0)
MCV: 84.2 fL (ref 80.0–100.0)
Platelets: 294 10*3/uL (ref 150–400)
RBC: 4.49 MIL/uL (ref 3.87–5.11)
RDW: 14.1 % (ref 11.5–15.5)
WBC: 8.5 10*3/uL (ref 4.0–10.5)
nRBC: 0 % (ref 0.0–0.2)

## 2022-04-03 LAB — PREGNANCY, URINE: Preg Test, Ur: NEGATIVE

## 2022-04-03 LAB — TROPONIN I (HIGH SENSITIVITY): Troponin I (High Sensitivity): <2 ng/L (ref <18)

## 2022-04-03 NOTE — ED Triage Notes (Signed)
Patient states her blood pressure has been running high all day today, reports she started having tingling down her left arm around 8:30 pm. Reports she has had shortness of breath and nausea for the last few days.

## 2022-04-04 ENCOUNTER — Emergency Department (HOSPITAL_BASED_OUTPATIENT_CLINIC_OR_DEPARTMENT_OTHER): Payer: Medicaid Other

## 2022-04-04 ENCOUNTER — Emergency Department (HOSPITAL_BASED_OUTPATIENT_CLINIC_OR_DEPARTMENT_OTHER)
Admission: EM | Admit: 2022-04-04 | Discharge: 2022-04-04 | Disposition: A | Discharge status: Home / Self Care | Payer: Medicaid Other | Attending: Emergency Medicine | Admitting: Emergency Medicine

## 2022-04-04 DIAGNOSIS — R030 Elevated blood-pressure reading, without diagnosis of hypertension: Secondary | ICD-10-CM

## 2022-04-04 DIAGNOSIS — R202 Paresthesia of skin: Secondary | ICD-10-CM

## 2022-04-04 LAB — TROPONIN I (HIGH SENSITIVITY): Troponin I (High Sensitivity): <2 ng/L (ref <18)

## 2022-04-04 MED ORDER — POTASSIUM CHLORIDE CRYS ER 20 MEQ PO TBCR
20.0000 meq | EXTENDED_RELEASE_TABLET | Freq: Once | ORAL | Status: AC
  Administered 2022-04-04: 20 meq via ORAL
  Filled 2022-04-04: qty 1

## 2022-04-04 NOTE — ED Provider Notes (Signed)
[facility] EMERGENCY DEPARTMENT Provider Note   CSN: [id number] Arrival date & time: 04/03/22  2240     History  Chief Complaint  Patient presents with   Hypertension    Renee Mcdonald is a 37 y.o. female.  The history is provided by the patient.  Hypertension  Renee Mcdonald is a 37 y.o. female who presents to the Emergency Department complaining of elevated blood pressure.  She presents emergency department accompanied by her husband for evaluation of elevated blood pressures.  She checks her blood pressure regularly and today noticed that they have been high all day.  It has been around 145/100.  Around 830 this evening she developed tingling in the left arm that lasted until midnight.  She describes it as a pins and needle sensation.  She also reports feeling off and not quite right.  No associated headache, chest pain, neck pain, shortness of breath, nausea, vomiting. Tingling resolved prior to ED evaluation.  Family hx/o CVA  No medical problems.  Vapes.  Occasional etoh, no drugs.      Home Medications Prior to Admission medications   Medication Sig Start Date End Date Taking? Authorizing Provider  doxycycline (VIBRAMYCIN) 100 MG capsule Take 1 capsule (100 mg total) by mouth 2 (two) times daily. 09/38/18   Delora Fuel, MD  hydrOXYzine (ATARAX/VISTARIL) 25 MG tablet Take 1 tablet (25 mg total) by mouth 3 (three) times daily as needed for anxiety. 07/26/18   Jacqlyn Larsen, PA-C  metroNIDAZOLE (FLAGYL) 500 MG tablet Take 1 tablet (500 mg total) by mouth 2 (two) times daily. 29/93/71   Delora Fuel, MD      Allergies    Patient has no known allergies.    Review of Systems   Review of Systems  All other systems reviewed and are negative.   Physical Exam Updated Vital Signs BP (!) 144/98   Pulse 66   Temp 98.4 F (36.9 C) (Oral)   Resp 17   Ht 5\' 7"  (1.702 m)   Wt 88.5 kg   LMP 03/03/2022 (Approximate)   SpO2 100%   BMI 30.54 kg/m   Physical Exam Vitals and nursing note reviewed.  Constitutional:      Appearance: She is well-developed.  HENT:     Head: Normocephalic and atraumatic.  Cardiovascular:     Rate and Rhythm: Normal rate and regular rhythm.     Heart sounds: No murmur heard. Pulmonary:     Effort: Pulmonary effort is normal. No respiratory distress.     Breath sounds: Normal breath sounds.  Abdominal:     Palpations: Abdomen is soft.     Tenderness: There is no abdominal tenderness. There is no guarding or rebound.  Musculoskeletal:        General: No tenderness.  Skin:    General: Skin is warm and dry.  Neurological:     Mental Status: She is alert and oriented to person, place, and time.     Comments: No asymmetry of facial movements.  Visual fields grossly intact.  5 out of 5 strength in all 4 extremities with sensation to light touch intact in all 4 extremities  Psychiatric:        Behavior: Behavior normal.     ED Results / Procedures / Treatments   Labs (all labs ordered are listed, but only abnormal results are displayed) Labs Reviewed  BASIC METABOLIC PANEL - Abnormal; Notable for the following components:      Result Value   Sodium  134 (*)    Potassium 3.3 (*)    Glucose, Bld 128 (*)    All other components within normal limits  CBC - Abnormal; Notable for the following components:   Hemoglobin 11.7 (*)    All other components within normal limits  PREGNANCY, URINE  TROPONIN I (HIGH SENSITIVITY)  TROPONIN I (HIGH SENSITIVITY)    EKG EKG Interpretation  Date/Time:  Friday April 03 2022 23:05:38 EST Ventricular Rate:  96 PR Interval:  152 QRS Duration: 76 QT Interval:  388 QTC Calculation: 490 R Axis:   70 Text Interpretation: Normal sinus rhythm Nonspecific T wave abnormality Prolonged QT Abnormal ECG Confirmed by Quintella Reichert [phone]) on 04/04/2022 2:18:14 AM  Radiology CT Head Wo Contrast  Result Date: 04/04/2022 CLINICAL DATA:  Neuro deficit, acute, stroke  suspected LUE numbness since 830pm EXAM: CT HEAD WITHOUT CONTRAST TECHNIQUE: Contiguous axial images were obtained from the base of the skull through the vertex without intravenous contrast. RADIATION DOSE REDUCTION: This exam was performed according to the departmental dose-optimization program which includes automated exposure control, adjustment of the mA and/or kV according to patient size and/or use of iterative reconstruction technique. COMPARISON:  CT head 09/11/2019 FINDINGS: Brain: No evidence of large-territorial acute infarction. No parenchymal hemorrhage. No mass lesion. No extra-axial collection. No mass effect or midline shift. No hydrocephalus. Basilar cisterns are patent. Vascular: No hyperdense vessel. Skull: No acute fracture or focal lesion. Sinuses/Orbits: Paranasal sinuses and mastoid air cells are clear. The orbits are unremarkable. Other: None. IMPRESSION: No acute intracranial abnormality. Electronically Signed   By: Iven Finn M.D.   On: 04/04/2022 03:23   DG Chest 2 View  Result Date: 04/03/2022 CLINICAL DATA:  Shortness of breath EXAM: CHEST - 2 VIEW COMPARISON:  07/26/2018 FINDINGS: The heart size and mediastinal contours are within normal limits. Both lungs are clear. The visualized skeletal structures are unremarkable. IMPRESSION: No active cardiopulmonary disease. Electronically Signed   By: Inez Catalina M.D.   On: 04/03/2022 23:25    Procedures Procedures    Medications Ordered in ED Medications  potassium chloride SA (KLOR-CON M) CR tablet 20 mEq (20 mEq Oral Given 04/04/22 3762)    ED Course/ Medical Decision Making/ A&P       ABCD2 Score: 3                     Medical Decision Making Amount and/or Complexity of Data Reviewed Labs: ordered. Radiology: ordered.  Risk Prescription drug management.   Patient here for evaluation of elevated blood pressure readings at home, paresthesias to the left upper extremity.  Symptoms resolved at time of ED  evaluation.  No evidence of acute endorgan dysfunction.  CT head is negative for acute abnormality.  Patient with no focal neurologic deficits at time of evaluation.  Current clinical picture is not consistent with hypertensive urgency, CVA, dissection, ACS.  Doubt TIA.  Patient's blood pressure is elevated but improved at time of reevaluation.  Discussed with patient risks and benefits of initiating blood pressure medications in the emergency department.  Patient declines to initiate medications at this time.  She does report that she checks her blood pressure routinely and it is normally around 831 systolic.  Discussed importance of PCP follow-up as well as return precautions.        Final Clinical Impression(s) / ED Diagnoses Final diagnoses:  Paresthesia  Elevated blood pressure reading without diagnosis of hypertension    Rx / DC Orders ED Discharge Orders  None         Tilden Fossa, MD 04/04/22 [phone]

## 2022-04-04 NOTE — ED Notes (Signed)
To CT

## 2022-09-15 ENCOUNTER — Emergency Department (HOSPITAL_BASED_OUTPATIENT_CLINIC_OR_DEPARTMENT_OTHER)
Admission: EM | Admit: 2022-09-15 | Discharge: 2022-09-15 | Discharge status: Against Medical Advice | Payer: Medicaid Other | Attending: Emergency Medicine | Admitting: Emergency Medicine

## 2022-09-15 ENCOUNTER — Encounter (HOSPITAL_BASED_OUTPATIENT_CLINIC_OR_DEPARTMENT_OTHER): Payer: Self-pay | Admitting: Emergency Medicine

## 2022-09-15 ENCOUNTER — Other Ambulatory Visit: Payer: Self-pay

## 2022-09-15 DIAGNOSIS — Z5321 Procedure and treatment not carried out due to patient leaving prior to being seen by health care provider: Secondary | ICD-10-CM | POA: Insufficient documentation

## 2022-09-15 DIAGNOSIS — I10 Essential (primary) hypertension: Secondary | ICD-10-CM | POA: Diagnosis present

## 2022-09-15 DIAGNOSIS — R42 Dizziness and giddiness: Secondary | ICD-10-CM

## 2022-09-15 DIAGNOSIS — R519 Headache, unspecified: Secondary | ICD-10-CM

## 2022-09-15 DIAGNOSIS — I160 Hypertensive urgency: Secondary | ICD-10-CM | POA: Insufficient documentation

## 2022-09-15 LAB — CBC WITH DIFFERENTIAL/PLATELET
Abs Immature Granulocytes: 0.03 10*3/uL (ref 0.00–0.07)
Basophils Absolute: 0 10*3/uL (ref 0.0–0.1)
Basophils Relative: 0 %
Eosinophils Absolute: 0 10*3/uL (ref 0.0–0.5)
Eosinophils Relative: 0 %
HCT: 40.3 % (ref 36.0–46.0)
Hemoglobin: 12.8 g/dL (ref 12.0–15.0)
Immature Granulocytes: 0 %
Lymphocytes Relative: 26 %
Lymphs Abs: 2.4 10*3/uL (ref 0.7–4.0)
MCH: 26.4 pg (ref 26.0–34.0)
MCHC: 31.8 g/dL (ref 30.0–36.0)
MCV: 83.1 fL (ref 80.0–100.0)
Monocytes Absolute: 0.6 10*3/uL (ref 0.1–1.0)
Monocytes Relative: 7 %
Neutro Abs: 6.2 10*3/uL (ref 1.7–7.7)
Neutrophils Relative %: 67 %
Platelets: 295 10*3/uL (ref 150–400)
RBC: 4.85 MIL/uL (ref 3.87–5.11)
RDW: 13.5 % (ref 11.5–15.5)
WBC: 9.3 10*3/uL (ref 4.0–10.5)
nRBC: 0 % (ref 0.0–0.2)

## 2022-09-15 LAB — BASIC METABOLIC PANEL
Anion gap: 12 (ref 5–15)
BUN: 10 mg/dL (ref 6–20)
CO2: 23 mmol/L (ref 22–32)
Calcium: 10 mg/dL (ref 8.9–10.3)
Chloride: 101 mmol/L (ref 98–111)
Creatinine, Ser: 0.74 mg/dL (ref 0.44–1.00)
GFR, Estimated: >60 mL/min (ref >60)
Glucose, Bld: 110 mg/dL — ABNORMAL HIGH (ref 70–99)
Potassium: 3.2 mmol/L — ABNORMAL LOW (ref 3.5–5.1)
Sodium: 136 mmol/L (ref 135–145)

## 2022-09-15 MED ORDER — LISINOPRIL 20 MG PO TABS: 20.0000 mg | ORAL_TABLET | Freq: Every day | ORAL | 0 refills | Status: AC

## 2022-09-15 MED ORDER — LISINOPRIL 10 MG PO TABS
10.0000 mg | ORAL_TABLET | Freq: Once | ORAL | Status: AC
  Administered 2022-09-15: 10 mg via ORAL
  Filled 2022-09-15: qty 1

## 2022-09-15 NOTE — ED Notes (Addendum)
Attempted to obtain troponin ordered by Dr. Wallace Cullens however patient stating that she had been up since 4 am, came into the ED at 6 tonight and would rather not wait until the troponin returns. Notified Dr. Wallace Cullens of this. Dr. Wallace Cullens to speak with patient. Patient to be discharged AMA.

## 2022-09-15 NOTE — ED Provider Notes (Signed)
[place] [facility] AT [facility] Provider Note   CSN: [id number] Arrival date & time: 09/15/22  1805     History  Chief Complaint  Patient presents with   Hypertension    Kamiah Avilla is a 37 y.o. female.  Patient is a 37 yo female, former smoker, presenting for symptomatic hypertension. Pt admits to dizziness, light headedness, and headache. Home bp reading of 166/100. Admits to diet of fast food the last two days. Has not seen a pcp in the past 5 years. Denies chest pain or sob. Denies vision changes.   The history is provided by the patient. No language interpreter was used.  Hypertension Pertinent negatives include no chest pain, no abdominal pain and no shortness of breath.       Home Medications Prior to Admission medications   Medication Sig Start Date End Date Taking? Authorizing Provider  lisinopril (ZESTRIL) 20 MG tablet Take 1 tablet (20 mg total) by mouth daily. 09/15/22  Yes Edwin Dada P, DO  doxycycline (VIBRAMYCIN) 100 MG capsule Take 1 capsule (100 mg total) by mouth 2 (two) times daily. 03/21/20   Dione Booze, MD  hydrOXYzine (ATARAX/VISTARIL) 25 MG tablet Take 1 tablet (25 mg total) by mouth 3 (three) times daily as needed for anxiety. 07/26/18   Dartha Lodge, PA-C  metroNIDAZOLE (FLAGYL) 500 MG tablet Take 1 tablet (500 mg total) by mouth 2 (two) times daily. 03/21/20   Dione Booze, MD      Allergies    Patient has no known allergies.    Review of Systems   Review of Systems  Constitutional:  Negative for chills and fever.  HENT:  Negative for ear pain and sore throat.   Eyes:  Negative for pain and visual disturbance.  Respiratory:  Negative for cough and shortness of breath.   Cardiovascular:  Negative for chest pain and palpitations.  Gastrointestinal:  Negative for abdominal pain and vomiting.  Genitourinary:  Negative for dysuria and hematuria.  Musculoskeletal:  Negative for arthralgias and back pain.  Skin:   Negative for color change and rash.  Neurological:  Positive for dizziness and light-headedness. Negative for seizures and syncope.  All other systems reviewed and are negative.   Physical Exam Updated Vital Signs BP (!) 156/109   Pulse 72   Temp 99 F (37.2 C) (Oral)   Resp 17   Ht 5\' 7"  (1.702 m)   Wt 86.2 kg   SpO2 100%   BMI 29.76 kg/m  Physical Exam Vitals and nursing note reviewed.  Constitutional:      General: She is not in acute distress.    Appearance: She is well-developed.  HENT:     Head: Normocephalic and atraumatic.  Eyes:     Conjunctiva/sclera: Conjunctivae normal.  Cardiovascular:     Rate and Rhythm: Normal rate and regular rhythm.     Heart sounds: No murmur heard. Pulmonary:     Effort: Pulmonary effort is normal. No respiratory distress.     Breath sounds: Normal breath sounds.  Abdominal:     Palpations: Abdomen is soft.     Tenderness: There is no abdominal tenderness.  Musculoskeletal:        General: No swelling.     Cervical back: Neck supple.  Skin:    General: Skin is warm and dry.     Capillary Refill: Capillary refill takes less than 2 seconds.  Neurological:     Mental Status: She is alert.  Psychiatric:  Mood and Affect: Mood normal.     ED Results / Procedures / Treatments   Labs (all labs ordered are listed, but only abnormal results are displayed) Labs Reviewed  BASIC METABOLIC PANEL - Abnormal; Notable for the following components:      Result Value   Potassium 3.2 (*)    Glucose, Bld 110 (*)    All other components within normal limits  CBC WITH DIFFERENTIAL/PLATELET    EKG EKG Interpretation Date/Time:  Tuesday September 15 2022 18:41:45 EDT Ventricular Rate:  83 PR Interval:  146 QRS Duration:  87 QT Interval:  356 QTC Calculation: 419 R Axis:   59  Text Interpretation: Sinus rhythm Consider left ventricular hypertrophy Confirmed by Edwin Dada ([id number]) on 09/15/2022 6:49:03 PM  Radiology No results  found.  Procedures Procedures    Medications Ordered in ED Medications  lisinopril (ZESTRIL) tablet 10 mg (10 mg Oral Given 09/15/22 2203)    ED Course/ Medical Decision Making/ A&P                             Medical Decision Making Amount and/or Complexity of Data Reviewed Labs: ordered.  Risk Prescription drug management.   37 yo female, former smoker, presenting for symptomatic hypertension. Pt admits to dizziness, light headedness, and headache.  Patient is alert oriented x 3, no acute distress, afebrile, stable vital signs.  Physical exam demonstrates regular rate and rhythm.  Patient hypertensive at 66/114 on arrival.  Patient given lisinopril 10 mg in ED.  Laboratory studies ordered due to patient's symptomology to rule out hypertensive emergency.  All to bedside, patient requesting to leave prior to lab studies being completed.  Patient recommended to start taking lisinopril 20 mg tablets at home, decrease salt, and increase cardiovascular exercise.  Patient recommended to follow closely with primary care physician for BP recheck in one week.   The patient has requested to leave the ED against medical advice. I believe this patient is of sound mind and medical decision making capacity to refuse medical care. The patient is responding and asking questions appropriately. The patient is oriented to person, place and time. The patient is not psychotic, delusional, suicidal, homicidal or hallucinating. The patient demonstrates a normal mental capacity to make decisions regarding their healthcare. The patient is clinically sober and does not appear to be under the influence of any illicit drugs at this time. The patient has been advised of the risks, in layman terms, of leaving AMA which include, but are not limited to death, coma, permanent disability, loss of current lifestyle, delay in diagnosis. Alternatives have been offered - the patient remains steadfast in their wish to leave.  The patient has been advised that should they change their mind they are welcome to return to this hospital, or any other, at any time. The patient understands that in no way does an AMA discharge mean that I do not want them to have the best medical care available. To this end, I have offered appropriate prescriptions, referrals, and discharge instructions. The patient did sign AMA paperwork. The above discussion was witnessed by another member of staff.         Final Clinical Impression(s) / ED Diagnoses Final diagnoses:  Hypertensive urgency  Dizziness  Light headedness  Nonintractable headache, unspecified chronicity pattern, unspecified headache type    Rx / DC Orders ED Discharge Orders          Ordered  lisinopril (ZESTRIL) 20 MG tablet  Daily        09/15/22 2233              Franne Forts, DO 09/23/22 1756

## 2022-09-15 NOTE — ED Triage Notes (Signed)
Patient presents to ED via POV from home. Here with hypertension. Denies history of same. Reports tongue numbness. Denies facial numbness. Clear speech. Steady gait. Denies chest pain or visual changes.

## 2022-09-15 NOTE — Discharge Instructions (Signed)
Please call make an appointment for elevated blood pressure readings in the emergency department and repeat blood pressure readings.
